# Patient Record
Sex: Male | Born: 1935 | Race: White | Hispanic: No | Marital: Married | Smoking: Former smoker
Health system: Southern US, Community
[De-identification: ages and names within clinical notes are randomized; demographics above are authoritative.]

## PROBLEM LIST (undated history)

## (undated) DIAGNOSIS — R269 Unspecified abnormalities of gait and mobility: Secondary | ICD-10-CM

## (undated) DIAGNOSIS — G20A1 Parkinson's disease without dyskinesia, without mention of fluctuations: Secondary | ICD-10-CM

## (undated) DIAGNOSIS — N138 Other obstructive and reflux uropathy: Secondary | ICD-10-CM

## (undated) DIAGNOSIS — I10 Essential (primary) hypertension: Secondary | ICD-10-CM

## (undated) DIAGNOSIS — N401 Enlarged prostate with lower urinary tract symptoms: Secondary | ICD-10-CM

## (undated) DIAGNOSIS — F0390 Unspecified dementia without behavioral disturbance: Secondary | ICD-10-CM

## (undated) DIAGNOSIS — I2510 Atherosclerotic heart disease of native coronary artery without angina pectoris: Secondary | ICD-10-CM

## (undated) DIAGNOSIS — G20 Parkinson's disease: Secondary | ICD-10-CM

## (undated) DIAGNOSIS — Q438 Other specified congenital malformations of intestine: Secondary | ICD-10-CM

## (undated) DIAGNOSIS — N529 Male erectile dysfunction, unspecified: Secondary | ICD-10-CM

## (undated) DIAGNOSIS — G4733 Obstructive sleep apnea (adult) (pediatric): Secondary | ICD-10-CM

## (undated) DIAGNOSIS — E785 Hyperlipidemia, unspecified: Secondary | ICD-10-CM

## (undated) HISTORY — DX: Parkinson's disease: G20

## (undated) HISTORY — DX: Unspecified dementia, unspecified severity, without behavioral disturbance, psychotic disturbance, mood disturbance, and anxiety: F03.90

## (undated) HISTORY — DX: Unspecified abnormalities of gait and mobility: R26.9

## (undated) HISTORY — DX: Atherosclerotic heart disease of native coronary artery without angina pectoris: I25.10

## (undated) HISTORY — PX: TONSILLECTOMY: SUR1361

## (undated) HISTORY — PX: KNEE SURGERY: SHX244

## (undated) HISTORY — DX: Essential (primary) hypertension: I10

## (undated) HISTORY — DX: Benign prostatic hyperplasia with lower urinary tract symptoms: N40.1

## (undated) HISTORY — DX: Other specified congenital malformations of intestine: Q43.8

## (undated) HISTORY — DX: Male erectile dysfunction, unspecified: N52.9

## (undated) HISTORY — PX: APPENDECTOMY: SHX54

## (undated) HISTORY — DX: Parkinson's disease without dyskinesia, without mention of fluctuations: G20.A1

## (undated) HISTORY — PX: OTHER SURGICAL HISTORY: SHX169

## (undated) HISTORY — DX: Hyperlipidemia, unspecified: E78.5

## (undated) HISTORY — DX: Benign prostatic hyperplasia with lower urinary tract symptoms: N13.8

## (undated) HISTORY — DX: Obstructive sleep apnea (adult) (pediatric): G47.33

## (undated) HISTORY — PX: SHOULDER SURGERY: SHX246

---

## 1998-12-28 ENCOUNTER — Inpatient Hospital Stay (HOSPITAL_COMMUNITY): Admission: RE | Admit: 1998-12-28 | Discharge: 1999-01-01 | Payer: Self-pay | Admitting: Orthopaedic Surgery

## 1999-03-01 ENCOUNTER — Inpatient Hospital Stay (HOSPITAL_COMMUNITY): Admission: RE | Admit: 1999-03-01 | Discharge: 1999-03-04 | Payer: Self-pay | Admitting: Orthopaedic Surgery

## 1999-07-11 ENCOUNTER — Other Ambulatory Visit: Admission: RE | Admit: 1999-07-11 | Discharge: 1999-07-11 | Payer: Self-pay | Admitting: Urology

## 2002-07-02 ENCOUNTER — Other Ambulatory Visit: Admission: RE | Admit: 2002-07-02 | Discharge: 2002-07-02 | Payer: Self-pay | Admitting: Dermatology

## 2003-05-12 ENCOUNTER — Inpatient Hospital Stay (HOSPITAL_BASED_OUTPATIENT_CLINIC_OR_DEPARTMENT_OTHER): Admission: RE | Admit: 2003-05-12 | Discharge: 2003-05-12 | Payer: Self-pay | Admitting: Cardiology

## 2003-12-09 ENCOUNTER — Ambulatory Visit (HOSPITAL_COMMUNITY): Admission: RE | Admit: 2003-12-09 | Discharge: 2003-12-09 | Payer: Self-pay | Admitting: Internal Medicine

## 2004-02-09 ENCOUNTER — Ambulatory Visit: Payer: Self-pay | Admitting: Family Medicine

## 2004-04-13 ENCOUNTER — Other Ambulatory Visit: Admission: RE | Admit: 2004-04-13 | Discharge: 2004-04-13 | Payer: Self-pay | Admitting: Dermatology

## 2004-04-27 ENCOUNTER — Ambulatory Visit: Payer: Self-pay | Admitting: Family Medicine

## 2004-05-11 ENCOUNTER — Ambulatory Visit: Payer: Self-pay | Admitting: *Deleted

## 2004-06-15 ENCOUNTER — Ambulatory Visit: Payer: Self-pay | Admitting: Family Medicine

## 2004-08-17 ENCOUNTER — Ambulatory Visit: Payer: Self-pay

## 2004-08-25 ENCOUNTER — Ambulatory Visit: Payer: Self-pay | Admitting: Family Medicine

## 2005-01-31 ENCOUNTER — Ambulatory Visit: Payer: Self-pay | Admitting: Family Medicine

## 2005-05-02 ENCOUNTER — Ambulatory Visit: Payer: Self-pay | Admitting: Family Medicine

## 2005-08-09 ENCOUNTER — Ambulatory Visit: Payer: Self-pay | Admitting: Family Medicine

## 2005-08-16 ENCOUNTER — Ambulatory Visit: Payer: Self-pay | Admitting: *Deleted

## 2005-08-22 ENCOUNTER — Encounter (HOSPITAL_COMMUNITY): Admission: RE | Admit: 2005-08-22 | Discharge: 2005-09-21 | Payer: Self-pay | Admitting: *Deleted

## 2005-08-22 ENCOUNTER — Ambulatory Visit: Payer: Self-pay | Admitting: *Deleted

## 2005-08-29 ENCOUNTER — Ambulatory Visit: Payer: Self-pay | Admitting: *Deleted

## 2005-09-18 ENCOUNTER — Ambulatory Visit: Payer: Self-pay | Admitting: Family Medicine

## 2005-10-11 ENCOUNTER — Ambulatory Visit: Payer: Self-pay | Admitting: Family Medicine

## 2005-11-14 ENCOUNTER — Ambulatory Visit (HOSPITAL_COMMUNITY): Admission: RE | Admit: 2005-11-14 | Discharge: 2005-11-15 | Payer: Self-pay | Admitting: Urology

## 2005-11-17 ENCOUNTER — Emergency Department (HOSPITAL_COMMUNITY): Admission: EM | Admit: 2005-11-17 | Discharge: 2005-11-17 | Payer: Self-pay | Admitting: Emergency Medicine

## 2005-12-27 ENCOUNTER — Ambulatory Visit: Payer: Self-pay | Admitting: Family Medicine

## 2006-01-03 ENCOUNTER — Ambulatory Visit: Payer: Self-pay | Admitting: Family Medicine

## 2006-01-04 ENCOUNTER — Ambulatory Visit (HOSPITAL_COMMUNITY): Admission: RE | Admit: 2006-01-04 | Discharge: 2006-01-04 | Payer: Self-pay | Admitting: Family Medicine

## 2006-01-17 ENCOUNTER — Ambulatory Visit (HOSPITAL_COMMUNITY): Payer: Self-pay | Admitting: Psychology

## 2006-02-13 ENCOUNTER — Ambulatory Visit (HOSPITAL_COMMUNITY): Payer: Self-pay | Admitting: Psychology

## 2006-03-13 ENCOUNTER — Ambulatory Visit: Payer: Self-pay | Admitting: Cardiovascular Disease

## 2006-04-15 ENCOUNTER — Encounter: Payer: Self-pay | Admitting: Family Medicine

## 2006-04-15 LAB — CONVERTED CEMR LAB
BUN: 13 mg/dL (ref 6–23)
Basophils Relative: 0 % (ref 0–1)
Cholesterol: 152 mg/dL (ref 0–200)
HCT: 43.6 % (ref 39.0–52.0)
LDL Cholesterol: 96 mg/dL (ref 0–99)
Lymphs Abs: 1.2 10*3/uL (ref 0.7–3.3)
MCV: 91.8 fL (ref 78.0–100.0)
Monocytes Absolute: 0.7 10*3/uL (ref 0.2–0.7)
Monocytes Relative: 17 % — ABNORMAL HIGH (ref 3–11)
Neutro Abs: 2.4 10*3/uL (ref 1.7–7.7)
Platelets: 193 10*3/uL (ref 150–400)
Potassium: 4.8 meq/L (ref 3.5–5.3)
RDW: 13.9 % (ref 11.5–14.0)
TSH: 1.507 microintl units/mL (ref 0.350–5.50)
Triglycerides: 61 mg/dL (ref <150)
VLDL: 12 mg/dL (ref 0–40)

## 2006-04-17 ENCOUNTER — Ambulatory Visit: Payer: Self-pay | Admitting: Family Medicine

## 2006-06-06 ENCOUNTER — Ambulatory Visit (HOSPITAL_COMMUNITY): Admission: RE | Admit: 2006-06-06 | Discharge: 2006-06-06 | Payer: Self-pay | Admitting: Neurology

## 2006-08-28 ENCOUNTER — Ambulatory Visit: Payer: Self-pay

## 2006-09-20 ENCOUNTER — Ambulatory Visit: Payer: Self-pay | Admitting: Cardiovascular Disease

## 2006-10-03 ENCOUNTER — Encounter: Payer: Self-pay | Admitting: Family Medicine

## 2006-10-03 LAB — CONVERTED CEMR LAB
Cholesterol: 141 mg/dL (ref 0–200)
LDL Cholesterol: 91 mg/dL (ref 0–99)
Total CHOL/HDL Ratio: 3.7
VLDL: 12 mg/dL (ref 0–40)

## 2006-10-09 ENCOUNTER — Ambulatory Visit: Payer: Self-pay | Admitting: Family Medicine

## 2006-11-14 ENCOUNTER — Encounter: Admission: RE | Admit: 2006-11-14 | Discharge: 2006-11-14 | Payer: Self-pay | Admitting: Neurology

## 2006-11-21 ENCOUNTER — Ambulatory Visit: Payer: Self-pay | Admitting: Family Medicine

## 2006-12-12 ENCOUNTER — Encounter: Admission: RE | Admit: 2006-12-12 | Discharge: 2006-12-12 | Payer: Self-pay | Admitting: Neurology

## 2007-01-01 ENCOUNTER — Ambulatory Visit: Payer: Self-pay | Admitting: Family Medicine

## 2007-01-08 ENCOUNTER — Encounter: Admission: RE | Admit: 2007-01-08 | Discharge: 2007-01-08 | Payer: Self-pay | Admitting: Neurology

## 2007-01-30 ENCOUNTER — Ambulatory Visit: Payer: Self-pay | Admitting: Gastroenterology

## 2007-01-30 ENCOUNTER — Ambulatory Visit (HOSPITAL_COMMUNITY): Admission: RE | Admit: 2007-01-30 | Discharge: 2007-01-30 | Payer: Self-pay | Admitting: Gastroenterology

## 2007-02-07 ENCOUNTER — Encounter: Payer: Self-pay | Admitting: Family Medicine

## 2007-02-07 LAB — CONVERTED CEMR LAB
Cholesterol: 146 mg/dL (ref 0–200)
Triglycerides: 51 mg/dL (ref <150)
VLDL: 10 mg/dL (ref 0–40)

## 2007-02-13 ENCOUNTER — Ambulatory Visit: Payer: Self-pay | Admitting: Family Medicine

## 2007-02-13 ENCOUNTER — Ambulatory Visit (HOSPITAL_COMMUNITY): Admission: RE | Admit: 2007-02-13 | Discharge: 2007-02-13 | Payer: Self-pay | Admitting: Family Medicine

## 2007-03-12 ENCOUNTER — Ambulatory Visit: Payer: Self-pay | Admitting: Cardiovascular Disease

## 2007-03-12 ENCOUNTER — Encounter (HOSPITAL_COMMUNITY): Admission: RE | Admit: 2007-03-12 | Discharge: 2007-04-02 | Payer: Self-pay | Admitting: Cardiovascular Disease

## 2007-06-12 ENCOUNTER — Encounter: Payer: Self-pay | Admitting: Family Medicine

## 2007-06-12 LAB — CONVERTED CEMR LAB
Basophils Relative: 1 % (ref 0–1)
CO2: 26 meq/L (ref 19–32)
Chloride: 101 meq/L (ref 96–112)
Hemoglobin: 15.2 g/dL (ref 13.0–17.0)
Lymphs Abs: 1 10*3/uL (ref 0.7–4.0)
MCHC: 33.8 g/dL (ref 30.0–36.0)
Monocytes Absolute: 0.7 10*3/uL (ref 0.1–1.0)
Monocytes Relative: 16 % — ABNORMAL HIGH (ref 3–12)
Potassium: 4.3 meq/L (ref 3.5–5.3)
RBC: 4.84 M/uL (ref 4.22–5.81)
Sodium: 138 meq/L (ref 135–145)
Total CHOL/HDL Ratio: 3.3
VLDL: 10 mg/dL (ref 0–40)

## 2007-06-18 ENCOUNTER — Ambulatory Visit: Payer: Self-pay | Admitting: Family Medicine

## 2007-08-20 ENCOUNTER — Ambulatory Visit: Payer: Self-pay

## 2007-08-21 ENCOUNTER — Ambulatory Visit: Payer: Self-pay | Admitting: Cardiovascular Disease

## 2007-10-01 ENCOUNTER — Encounter: Payer: Self-pay | Admitting: Family Medicine

## 2007-10-01 LAB — CONVERTED CEMR LAB
CO2: 29 meq/L (ref 19–32)
Calcium: 9.3 mg/dL (ref 8.4–10.5)
Cholesterol: 152 mg/dL (ref 0–200)
HDL: 44 mg/dL (ref >39)
LDL Cholesterol: 98 mg/dL (ref 0–99)
Potassium: 4.2 meq/L (ref 3.5–5.3)
Triglycerides: 51 mg/dL (ref <150)
VLDL: 10 mg/dL (ref 0–40)

## 2007-10-02 ENCOUNTER — Ambulatory Visit: Payer: Self-pay | Admitting: Family Medicine

## 2007-10-09 DIAGNOSIS — I10 Essential (primary) hypertension: Secondary | ICD-10-CM | POA: Insufficient documentation

## 2007-10-09 DIAGNOSIS — R269 Unspecified abnormalities of gait and mobility: Secondary | ICD-10-CM

## 2007-10-09 DIAGNOSIS — F068 Other specified mental disorders due to known physiological condition: Secondary | ICD-10-CM

## 2007-10-09 DIAGNOSIS — H9190 Unspecified hearing loss, unspecified ear: Secondary | ICD-10-CM | POA: Insufficient documentation

## 2007-11-17 ENCOUNTER — Telehealth: Payer: Self-pay | Admitting: Family Medicine

## 2008-01-02 ENCOUNTER — Ambulatory Visit: Payer: Self-pay | Admitting: Family Medicine

## 2008-01-02 DIAGNOSIS — G20 Parkinson's disease: Secondary | ICD-10-CM

## 2008-01-08 ENCOUNTER — Encounter: Payer: Self-pay | Admitting: Family Medicine

## 2008-02-04 ENCOUNTER — Telehealth: Payer: Self-pay | Admitting: Family Medicine

## 2008-03-02 ENCOUNTER — Ambulatory Visit: Payer: Self-pay | Admitting: Cardiovascular Disease

## 2008-03-11 ENCOUNTER — Telehealth: Payer: Self-pay | Admitting: Family Medicine

## 2008-04-27 ENCOUNTER — Telehealth: Payer: Self-pay | Admitting: Family Medicine

## 2008-04-30 ENCOUNTER — Encounter: Payer: Self-pay | Admitting: Family Medicine

## 2008-04-30 LAB — CONVERTED CEMR LAB
ALT: 8 units/L (ref 0–53)
AST: 18 units/L (ref 0–37)
Albumin: 4.5 g/dL (ref 3.5–5.2)
Alkaline Phosphatase: 54 units/L (ref 39–117)
CO2: 28 meq/L (ref 19–32)
Calcium: 9.5 mg/dL (ref 8.4–10.5)
Chloride: 100 meq/L (ref 96–112)
Glucose, Bld: 80 mg/dL (ref 70–99)
HDL: 51 mg/dL (ref >39)
LDL Cholesterol: 101 mg/dL — ABNORMAL HIGH (ref 0–99)
Sodium: 138 meq/L (ref 135–145)
VLDL: 15 mg/dL (ref 0–40)

## 2008-05-18 ENCOUNTER — Ambulatory Visit: Payer: Self-pay | Admitting: Family Medicine

## 2008-09-10 ENCOUNTER — Telehealth: Payer: Self-pay | Admitting: Family Medicine

## 2008-09-30 ENCOUNTER — Telehealth: Payer: Self-pay | Admitting: Family Medicine

## 2008-10-11 ENCOUNTER — Ambulatory Visit: Payer: Self-pay | Admitting: Family Medicine

## 2008-10-21 ENCOUNTER — Encounter: Payer: Self-pay | Admitting: Family Medicine

## 2008-11-01 ENCOUNTER — Encounter: Admission: RE | Admit: 2008-11-01 | Discharge: 2008-11-01 | Payer: Self-pay | Admitting: Neurology

## 2008-11-02 IMAGING — NM NM MYOCAR MULTI W/ SPECT
2 series · 12 of 12 positions shown · non-contrast
Comparison: none

CLINICAL DATA: Mr. Prince Kyle is a 78-year-old patient of myself and Dr. Pineas.  He has a moderate LAD disease by cath in Thursday March, 2006.  Study was done to rule out ischemia.  
 STRESS MYOVIEW STUDY:
 Adenosine protocol:  140 mcg/kg/minute of Adenosine infused over 4 minutes.  Heart rate increased from 50-68.  Blood pressure was stable.  Patient had no symptoms.  Baseline EKG was normal.  There was no high grade heart block during infusion.  
 SCINTIGRAPHIC RESULTS:  Images were reconstructed in the short axis, vertical, and horizontal long axis.  Resting and stress images were normal.  There was no evidence of ischemia or infarction.  Gated images suggested an ejection fraction of 48%; however, visually it appeared normal and there were no discrete regional wall motion abnormalities.

[Series 1: cr cardiac tc low dose · 6.52mm/px · 6 of 64 frames shown]
[frame 6/64]
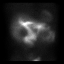
[frame 16/64]
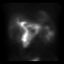
[frame 27/64]
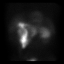
[frame 38/64]
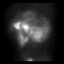
[frame 48/64]
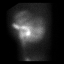
[frame 59/64]
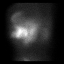

[Series 1: cs cardiac tc hi dose · 6.52mm/px · 6 of 512 frames shown]
[frame 43/512]
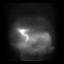
[frame 128/512]
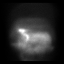
[frame 214/512]
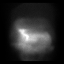
[frame 299/512]
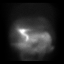
[frame 384/512]
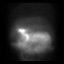
[frame 470/512]
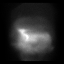

[12 of 12 positions shown; findings below may reference images not displayed]

IMPRESSION: Essentially normal Adenosine Myoview study with no evidence of ischemia or infarction.  Gated images suggest an ejection fraction of 48% but visually appears normal.

## 2008-11-04 ENCOUNTER — Encounter: Admission: RE | Admit: 2008-11-04 | Discharge: 2008-11-04 | Payer: Self-pay | Admitting: Neurology

## 2008-11-22 ENCOUNTER — Telehealth: Payer: Self-pay | Admitting: Family Medicine

## 2008-12-21 ENCOUNTER — Telehealth: Payer: Self-pay | Admitting: Family Medicine

## 2008-12-23 ENCOUNTER — Ambulatory Visit: Payer: Self-pay | Admitting: Family Medicine

## 2009-01-06 ENCOUNTER — Telehealth: Payer: Self-pay | Admitting: Family Medicine

## 2009-01-17 ENCOUNTER — Telehealth: Payer: Self-pay | Admitting: Family Medicine

## 2009-01-18 LAB — CONVERTED CEMR LAB
Basophils Absolute: 0 10*3/uL (ref 0.0–0.1)
Basophils Relative: 1 % (ref 0–1)
Chloride: 98 meq/L (ref 96–112)
Cholesterol: 156 mg/dL (ref 0–200)
Eosinophils Absolute: 0.1 10*3/uL (ref 0.0–0.7)
Eosinophils Relative: 2 % (ref 0–5)
Glucose, Bld: 84 mg/dL (ref 70–99)
HDL: 51 mg/dL (ref >39)
Hemoglobin: 14.1 g/dL (ref 13.0–17.0)
Lymphs Abs: 0.8 10*3/uL (ref 0.7–4.0)
Monocytes Absolute: 0.6 10*3/uL (ref 0.1–1.0)
Monocytes Relative: 16 % — ABNORMAL HIGH (ref 3–12)
Potassium: 4.8 meq/L (ref 3.5–5.3)
RDW: 13.8 % (ref 11.5–15.5)
Sodium: 134 meq/L — ABNORMAL LOW (ref 135–145)
TSH: 1.087 microintl units/mL (ref 0.350–4.500)
Total CHOL/HDL Ratio: 3.1

## 2009-02-02 ENCOUNTER — Ambulatory Visit: Payer: Self-pay | Admitting: Family Medicine

## 2009-02-02 ENCOUNTER — Ambulatory Visit (HOSPITAL_COMMUNITY): Admission: RE | Admit: 2009-02-02 | Discharge: 2009-02-02 | Payer: Self-pay | Admitting: Family Medicine

## 2009-02-02 DIAGNOSIS — R490 Dysphonia: Secondary | ICD-10-CM | POA: Insufficient documentation

## 2009-02-02 DIAGNOSIS — R05 Cough: Secondary | ICD-10-CM | POA: Insufficient documentation

## 2009-02-03 ENCOUNTER — Encounter: Payer: Self-pay | Admitting: Family Medicine

## 2009-02-07 ENCOUNTER — Telehealth: Payer: Self-pay | Admitting: Family Medicine

## 2009-02-23 ENCOUNTER — Encounter: Admission: RE | Admit: 2009-02-23 | Discharge: 2009-03-31 | Payer: Self-pay | Admitting: Podiatry

## 2009-02-23 ENCOUNTER — Telehealth: Payer: Self-pay | Admitting: Family Medicine

## 2009-03-01 ENCOUNTER — Ambulatory Visit: Payer: Self-pay | Admitting: Family Medicine

## 2009-03-01 DIAGNOSIS — L0291 Cutaneous abscess, unspecified: Secondary | ICD-10-CM

## 2009-03-01 DIAGNOSIS — L0390 Cellulitis, unspecified: Secondary | ICD-10-CM

## 2009-03-14 ENCOUNTER — Ambulatory Visit: Payer: Self-pay | Admitting: Family Medicine

## 2009-04-05 ENCOUNTER — Telehealth: Payer: Self-pay | Admitting: Family Medicine

## 2009-04-14 ENCOUNTER — Ambulatory Visit: Payer: Self-pay | Admitting: Family Medicine

## 2009-06-02 ENCOUNTER — Encounter: Admission: RE | Admit: 2009-06-02 | Discharge: 2009-06-02 | Payer: Self-pay | Admitting: Neurology

## 2009-06-11 ENCOUNTER — Emergency Department (HOSPITAL_COMMUNITY): Admission: EM | Admit: 2009-06-11 | Discharge: 2009-06-11 | Payer: Self-pay | Admitting: Emergency Medicine

## 2009-06-13 ENCOUNTER — Encounter: Payer: Self-pay | Admitting: Family Medicine

## 2009-06-14 ENCOUNTER — Ambulatory Visit: Payer: Self-pay | Admitting: Family Medicine

## 2009-06-14 ENCOUNTER — Ambulatory Visit (HOSPITAL_COMMUNITY): Admission: RE | Admit: 2009-06-14 | Discharge: 2009-06-14 | Payer: Self-pay | Admitting: Family Medicine

## 2009-06-14 DIAGNOSIS — D692 Other nonthrombocytopenic purpura: Secondary | ICD-10-CM | POA: Insufficient documentation

## 2009-06-14 DIAGNOSIS — M7989 Other specified soft tissue disorders: Secondary | ICD-10-CM | POA: Insufficient documentation

## 2009-06-14 DIAGNOSIS — R238 Other skin changes: Secondary | ICD-10-CM

## 2009-06-14 LAB — CONVERTED CEMR LAB
BUN: 7 mg/dL (ref 6–23)
CO2: 32 meq/L (ref 19–32)
Chloride: 94 meq/L — ABNORMAL LOW (ref 96–112)
Creatinine, Ser: 0.68 mg/dL (ref 0.40–1.50)
Eosinophils Absolute: 0 10*3/uL (ref 0.0–0.7)
Glucose, Bld: 113 mg/dL — ABNORMAL HIGH (ref 70–99)
HCT: 43.5 % (ref 39.0–52.0)
Lymphocytes Relative: 16 % (ref 12–46)
Lymphs Abs: 0.8 10*3/uL (ref 0.7–4.0)
MCHC: 34 g/dL (ref 30.0–36.0)
Monocytes Relative: 13 % — ABNORMAL HIGH (ref 3–12)
Platelets: 167 10*3/uL (ref 150–400)
Potassium: 4 meq/L (ref 3.5–5.3)
RBC: 4.64 M/uL (ref 4.22–5.81)
RDW: 14.1 % (ref 11.5–15.5)
Sodium: 130 meq/L — ABNORMAL LOW (ref 135–145)
WBC: 4.8 10*3/uL (ref 4.0–10.5)
aPTT: 32 s (ref 24–37)

## 2009-06-30 ENCOUNTER — Telehealth: Payer: Self-pay | Admitting: Family Medicine

## 2009-07-01 ENCOUNTER — Telehealth: Payer: Self-pay | Admitting: Family Medicine

## 2009-08-02 ENCOUNTER — Encounter: Admission: RE | Admit: 2009-08-02 | Discharge: 2009-08-02 | Payer: Self-pay | Admitting: Neurology

## 2009-08-03 ENCOUNTER — Encounter: Payer: Self-pay | Admitting: Family Medicine

## 2009-08-04 ENCOUNTER — Telehealth: Payer: Self-pay | Admitting: Family Medicine

## 2009-08-15 ENCOUNTER — Ambulatory Visit: Payer: Self-pay | Admitting: Family Medicine

## 2009-08-17 LAB — CONVERTED CEMR LAB
Chloride: 96 meq/L (ref 96–112)
Cholesterol: 153 mg/dL (ref 0–200)
Creatinine, Ser: 0.77 mg/dL (ref 0.40–1.50)
LDL Cholesterol: 91 mg/dL (ref 0–99)
Sodium: 134 meq/L — ABNORMAL LOW (ref 135–145)
Total CHOL/HDL Ratio: 3
Triglycerides: 55 mg/dL (ref <150)
VLDL: 11 mg/dL (ref 0–40)

## 2009-08-22 ENCOUNTER — Encounter: Payer: Self-pay | Admitting: Cardiovascular Disease

## 2009-08-22 DIAGNOSIS — I6529 Occlusion and stenosis of unspecified carotid artery: Secondary | ICD-10-CM

## 2009-08-23 ENCOUNTER — Encounter: Payer: Self-pay | Admitting: Cardiovascular Disease

## 2009-08-23 ENCOUNTER — Ambulatory Visit: Payer: Self-pay

## 2009-11-15 ENCOUNTER — Ambulatory Visit: Payer: Self-pay | Admitting: Family Medicine

## 2009-11-23 ENCOUNTER — Telehealth: Payer: Self-pay | Admitting: Family Medicine

## 2009-11-25 ENCOUNTER — Telehealth: Payer: Self-pay | Admitting: Family Medicine

## 2009-11-28 ENCOUNTER — Ambulatory Visit: Admission: RE | Admit: 2009-11-28 | Discharge: 2009-11-28 | Payer: Self-pay | Admitting: Family Medicine

## 2009-11-28 ENCOUNTER — Encounter: Payer: Self-pay | Admitting: Family Medicine

## 2009-11-29 ENCOUNTER — Telehealth: Payer: Self-pay | Admitting: Family Medicine

## 2009-12-08 ENCOUNTER — Telehealth: Payer: Self-pay | Admitting: Family Medicine

## 2009-12-14 ENCOUNTER — Encounter: Payer: Self-pay | Admitting: Family Medicine

## 2009-12-15 ENCOUNTER — Encounter: Payer: Self-pay | Admitting: Family Medicine

## 2009-12-21 ENCOUNTER — Telehealth: Payer: Self-pay | Admitting: Family Medicine

## 2010-01-19 ENCOUNTER — Telehealth: Payer: Self-pay | Admitting: Family Medicine

## 2010-02-17 ENCOUNTER — Telehealth: Payer: Self-pay | Admitting: Family Medicine

## 2010-02-20 ENCOUNTER — Ambulatory Visit: Payer: Self-pay | Admitting: Family Medicine

## 2010-03-01 ENCOUNTER — Telehealth: Payer: Self-pay | Admitting: Family Medicine

## 2010-03-06 ENCOUNTER — Telehealth (INDEPENDENT_AMBULATORY_CARE_PROVIDER_SITE_OTHER): Payer: Self-pay | Admitting: *Deleted

## 2010-03-06 ENCOUNTER — Encounter: Payer: Self-pay | Admitting: Family Medicine

## 2010-03-06 DIAGNOSIS — K219 Gastro-esophageal reflux disease without esophagitis: Secondary | ICD-10-CM

## 2010-03-08 ENCOUNTER — Telehealth: Payer: Self-pay | Admitting: Family Medicine

## 2010-04-17 ENCOUNTER — Telehealth: Payer: Self-pay | Admitting: Family Medicine

## 2010-04-23 ENCOUNTER — Encounter: Payer: Self-pay | Admitting: Neurology

## 2010-04-28 ENCOUNTER — Ambulatory Visit
Admission: RE | Admit: 2010-04-28 | Discharge: 2010-04-28 | Payer: Self-pay | Source: Home / Self Care | Attending: Family Medicine | Admitting: Family Medicine

## 2010-05-02 NOTE — Progress Notes (Signed)
Summary: speak with brandi   Phone Note Call from Patient   Summary of Call: pts wife would like to speak with brandi [phone] Initial call taken by: Rudene Anda,  July 01, 2009 9:54 AM  Follow-up for Phone Call        called back and left message Follow-up by: Everitt Amber LPN,  July 01, 2009 1:54 PM  Additional Follow-up for Phone Call Additional follow up Details #1::        She already picked up some hose at [facility] Additional Follow-up by: Everitt Amber LPN,  July 04, 2009 12:54 PM

## 2010-05-02 NOTE — Assessment & Plan Note (Signed)
Summary: office visit   Vital Signs:  Patient profile:   77 year old male Height:      67 inches Weight:      190.75 pounds BMI:     29.98 O2 Sat:      98 % on Room air Pulse rate:   56 / minute Pulse rhythm:   regular Resp:     16 per minute BP sitting:   122 / 80  (left arm)  Vitals Entered By: Worthy Keeler LPN (April 14, 2009 10:21 AM)  Nutrition Counseling: Patient's BMI is greater than 25 and therefore counseled on weight management options.  O2 Flow:  Room air CC: follow-up visit Is Patient Diabetic? No Pain Assessment Patient in pain? no        Primary Care Provider:  Syliva Overman MD  CC:  follow-up visit.  History of Present Illness: Reports  that he is  doing well. He is on his way to [place] to spend 3 months with his sister, and he wanted to come by one more time before he left . He leaves in the morning. His wife reprts that the area on his back is "drying up", no longer draining and does not appear to be painful.j Denies recent fever or chills. Denies sinus pressure, nasal congestion , ear pain or sore throat. Denies chest congestion, or cough productive of sputum. Denies chest pain, palpitations, PND, orthopnea or leg swelling. Denies abdominal pain, nausea, vomitting, diarrhea or constipation. Denies change in bowel movements or bloody stool.      Allergies (verified): No Known Drug Allergies  Review of Systems      See HPI  Physical Exam  General:  Well-developed,well-nourished,in no acute distress; alert,appropriate and cooperative throughout examination HEENT: No facial asymmetry,  EOMI, No sinus tenderness, TM's Clear, oropharynx  pink and moist.   Chest: Clear to auscultation bilaterally.  CVS: S1, S2, No murmurs, No S3.   Abd: Soft, Nontender.  MS: Adequate ROM spine, hips, shoulders and knees.  Ext: No edema.   CNS: CN 2-12 intact, power tone and sensation normal throughout.   Skin: Intact,  Psych: Good eye contact, flat  affect.  Memory losst, not anxious or depressed appearing.    Impression & Recommendations:  Problem # 1:  CELLULITIS (ICD-682.9) Assessment Improved  His updated medication list for this problem includes:    Doxycycline Hyclate 100 Mg Caps (Doxycycline hyclate) .Marland Kitchen... Take 1 capsule by mouth two times a day    Doxycycline Hyclate 100 Mg Caps (Doxycycline hyclate) ..... One tab by mouth bid  Problem # 2:  PARKINSON'S DISEASE (ICD-332.0) Assessment: Unchanged  Problem # 3:  HYPERTENSION (ICD-401.9) Assessment: Improved  His updated medication list for this problem includes:    Atenolol 25 Mg Tabs (Atenolol) ..... One tab by mouth once daily    Lisinopril-hydrochlorothiazide 20-25 Mg Tabs (Lisinopril-hydrochlorothiazide) .Marland Kitchen... Take one tab by mouth qd  BP today: 122/80 Prior BP: 138/80 (03/14/2009)  Labs Reviewed: K+: 4.8 (01/18/2009) Creat: : 0.74 (01/18/2009)   Chol: 156 (01/18/2009)   HDL: 51 (01/18/2009)   LDL: 95 (01/18/2009)   TG: 49 (01/18/2009)  Complete Medication List: 1)  Atenolol 25 Mg Tabs (Atenolol) .... One tab by mouth once daily 2)  Lisinopril-hydrochlorothiazide 20-25 Mg Tabs (Lisinopril-hydrochlorothiazide) .... Take one tab by mouth qd 3)  Avodart 0.5 Mg Caps (Dutasteride) .... Take one cap by mouth once daily 4)  Flomax 0.4 Mg Xr24h-cap (Tamsulosin hcl) .... Take one cap by mouth once daily 5)  Carbidopa-levodopa Cr 25-100 Mg Cr-tabs (Carbidopa-levodopa) .... Take one tab by mouth 4 times daily 6)  Amantadine Hcl 100 Mg Caps (Amantadine hcl) .... Take 1 cap by mouth once daily 7)  Comtan 200 Mg Tabs (Entacapone) .... Take one half tab by mouth four times a day 8)  Exelon 4.6 Mg/24hr Pt24 (Rivastigmine) .... 2 patches every morning 9)  Medrol (pak) 4 Mg Tabs (Methylprednisolone) .... Uad 10)  Prilosec Otc 20 Mg Tbec (Omeprazole magnesium) .... Take 1 capsule by mouth once a day 11)  Doxycycline Hyclate 100 Mg Caps (Doxycycline hyclate) .... Take 1 capsule by  mouth two times a day 12)  Doxycycline Hyclate 100 Mg Caps (Doxycycline hyclate) .... Take 1 capsule by mouth two times a day 13)  Doxycycline Hyclate 100 Mg Caps (Doxycycline hyclate) .... One tab by mouth bid 14)  Cutivate 0.05 % Crea (Fluticasone propionate) .... Apply topically two times a day prn  Patient Instructions: 1)  Please schedule a follow-up appointment in 3 months. 2)  the sore on your back looks much better. 3)  i will give you a script for some doxycycline yto hold on to, pls stop squeezing the area, keep it clean and uncovered, it is nearly fully heaed. 4)  if it starts to be painful and drainagain you will need the meds. 5)  SAFE TRIP , and GOD'S  SPEED! 6)  I will soon see you again Prescriptions: CUTIVATE 0.05 % CREA (FLUTICASONE PROPIONATE) apply topically two times a day prn  #30gm x 1   Entered by:   Worthy Keeler LPN   Authorized by:   Syliva Overman MD   Signed by:   Syliva Overman MD on 04/20/2009   Method used:   Handwritten   RxID:   [id number] DOXYCYCLINE HYCLATE 100 MG CAPS (DOXYCYCLINE HYCLATE) one tab by mouth bid  #20 x 0   Entered and Authorized by:   Syliva Overman MD   Signed by:   Syliva Overman MD on 04/20/2009   Method used:   Handwritten   RxID:   [id number]

## 2010-05-02 NOTE — Assessment & Plan Note (Signed)
Summary: f up   Vital Signs:  Patient profile:   77 year old male Height:      67 inches Weight:      189 pounds BMI:     29.71 Pulse rate:   62 / minute Pulse rhythm:   regular Resp:     16 per minute BP sitting:   122 / 82  (left arm) Cuff size:   regular  Vitals Entered By: Everitt Amber LPN (Aug 15, 2009 2:52 PM)  Nutrition Counseling: Patient's BMI is greater than 25 and therefore counseled on weight management options. CC: Folloq up chronic problems   Primary Care Provider:  Syliva Overman MD  CC:  Folloq up chronic problems.  History of Present Illness: Reports  that he has been doing fairlly well Denies recent fever or chills. Denies sinus pressure, nasal congestion , ear pain or sore throat. Denies chest congestion, or cough productive of sputum. Denies chest pain, palpitations, PND, orthopnea or leg swelling. Denies abdominal pain, nausea, vomitting, diarrhea or constipation. Denies change in bowel movements or bloody stool. Has chronic poor urinary stream for which he takes medication. has chronic  joint pain, with  reduced mobility. Denies headaches, vertigo, seizures. Denies depression, anxiety or insomnia. 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 One Tab By Mouth Once Daily 2)  Lisinopril-Hydrochlorothiazide 20-25 Mg Tabs (Lisinopril-Hydrochlorothiazide) .... Take One Tab By Mouth Qd 3)  Avodart 0.5 Mg Caps (Dutasteride) .... Take One Cap By Mouth Once Daily 4)  Flomax 0.4 Mg Xr24h-Cap (Tamsulosin Hcl) .... Take One Cap By Mouth Once Daily 5)  Carbidopa-Levodopa Cr 25-100 Mg Cr-Tabs (Carbidopa-Levodopa) .... Take One Tab By Mouth 4 Times Daily 6)  Amantadine Hcl 100 Mg Caps (Amantadine Hcl) .... Take 1 Cap By Mouth Once Daily 7)  Comtan 200 Mg Tabs (Entacapone) .... Take One Half Tab By Mouth Four Times A Day 8)  Exelon 4.6 Mg/24hr Pt24  (Rivastigmine) .... 2 Patches Every Morning 9)  Doxycycline Hyclate 100 Mg Caps (Doxycycline Hyclate) .... One Tab By Mouth Bid 10)  Cutivate 0.05 % Crea (Fluticasone Propionate) .... Apply Topically Two Times A Day Prn 11)  15-30mm Hg Knee High Stockings 12)  Omeprazole 20 Mg Cpdr (Omeprazole) .... One Tab By Mouth Once Daily   Discontinue Prilosec  Allergies (verified): No Known Drug Allergies  Review of Systems      See HPI General:  Complains of fatigue and weakness; denies chills and fever. Eyes:  Denies discharge and red eye. GU:  Complains of urinary hesitancy; denies nocturia; on medication for this. MS:  Complains of joint pain, muscle weakness, and stiffness. Derm:  Complains of lesion(s) and rash; 2 week h/o red area on buttock, no lesion seen on exam, however pt continues to have recurrence of infected sebaceous cysts. Neuro:  Complains of difficulty with concentration, disturbances in coordination, memory loss, poor balance, tremors, and weakness; denies tingling. Psych:  Denies anxiety, easily angered, suicidal thoughts/plans, and thoughts of violence. Endo:  Denies excessive thirst and polyuria. Heme:  Denies abnormal bruising and bleeding. Allergy:  Denies hives or rash and itching eyes.  Physical Exam  General:  Well-developed,well-nourished,in no acute distress; alert,appropriate and cooperative throughout examinationPt unable to stand withouit assistance and uses a wheelchair for mobility HEENT: No facial asymmetry,  EOMI, No sinus tenderness, TM's Clear, oropharynx  pink and moist.   Chest:  Clear to auscultation bilaterally.  CVS: S1, S2, No murmurs, No S3.   Abd: Soft, Nontender.  MS: decreased  ROM spine, hips, shoulders and knees.  Ext: No edema.   CNS: CN 2-12 intact, power tone and sensation normal throughout.   Skin: Intact, erythema of  sebaceous cyst on his back, no buttock lesion Psych: Good eye contact, flat affect.  Memory loss, not anxious or  depressed appearing.    Impression & Recommendations:  Problem # 1:  PARKINSON'S DISEASE (ICD-332.0) Assessment Unchanged pt is followed by neurology  Problem # 2:  DEMENTIA (ICD-294.8) Assessment: Unchanged pt to continue exelon patch  Problem # 3:  HYPERTENSION (ICD-401.9) Assessment: Unchanged  His updated medication list for this problem includes:    Atenolol 25 Mg Tabs (Atenolol) ..... One tab by mouth once daily    Lisinopril-hydrochlorothiazide 20-25 Mg Tabs (Lisinopril-hydrochlorothiazide) .Marland Kitchen... Take one tab by mouth qd  Orders: T-Basic Metabolic Panel ([id number])  BP today: 122/82 Prior BP: 128/80 (06/14/2009)  Labs Reviewed: K+: 4.0 (06/14/2009) Creat: : 0.68 (06/14/2009)   Chol: 156 (01/18/2009)   HDL: 51 (01/18/2009)   LDL: 95 (01/18/2009)   TG: 49 (01/18/2009)  Problem # 4:  CELLULITIS (ICD-682.9) Assessment: Deteriorated  His updated medication list for this problem includes:    Doxycycline Hyclate 100 Mg Caps (Doxycycline hyclate) ..... One tab by mouth bid  Complete Medication List: 1)  Atenolol 25 Mg Tabs (Atenolol) .... One tab by mouth once daily 2)  Lisinopril-hydrochlorothiazide 20-25 Mg Tabs (Lisinopril-hydrochlorothiazide) .... Take one tab by mouth qd 3)  Avodart 0.5 Mg Caps (Dutasteride) .... Take one cap by mouth once daily 4)  Flomax 0.4 Mg Xr24h-cap (Tamsulosin hcl) .... Take one cap by mouth once daily 5)  Carbidopa-levodopa Cr 25-100 Mg Cr-tabs (Carbidopa-levodopa) .... Take one tab by mouth 4 times daily 6)  Amantadine Hcl 100 Mg Caps (Amantadine hcl) .... Take 1 cap by mouth once daily 7)  Comtan 200 Mg Tabs (Entacapone) .... Take one half tab by mouth four times a day 8)  Exelon 4.6 Mg/24hr Pt24 (Rivastigmine) .... 2 patches every morning 9)  Doxycycline Hyclate 100 Mg Caps (Doxycycline hyclate) .... One tab by mouth bid 10)  Cutivate 0.05 % Crea (Fluticasone propionate) .... Apply topically two times a day prn 11)  15-29mm Hg Knee  High Stockings  12)  Omeprazole 20 Mg Cpdr (Omeprazole) .... One tab by mouth once daily   discontinue prilosec  Other Orders: T-Lipid Profile ([id number])  Patient Instructions: 1)  Please schedule a follow-up appointment in 3 months. 2)  BMP prior to visit, ICD-9: 3)  Lipid Panel prior to visit, ICD-9: 4)  No med changes at this time

## 2010-05-02 NOTE — Progress Notes (Signed)
Summary: CHOL CK  Phone Note Call from Patient   Summary of Call: WANTS TO KNOW DOES HE NEED HAVE HIS CHOL CHECKED  WHEN HE COMES IN MONDAY CALL BACK AND LET THEM KNOW Initial call taken by: Lind Guest,  February 17, 2010 10:09 AM  Follow-up for Phone Call        nl in may and on no meds, will not need it till next yr Follow-up by: Syliva Overman MD,  February 17, 2010 11:47 AM  Additional Follow-up for Phone Call Additional follow up Details #1::        patient aware Additional Follow-up by: Adella Hare LPN,  February 17, 2010 1:16 PM

## 2010-05-02 NOTE — Progress Notes (Signed)
Summary: PAPER FROM [facility]  Phone Note Call from Patient   Summary of Call: RUBY CALLED AND WANTS TO KNOW DID SHE TEL YOU ABOUT WHEN THE PAPERS CAME FROM [facility] TO PUT HER NAME AND # ON IT IF NOT PLEASE DO IT Initial call taken by: Lind Guest,  November 29, 2009 8:48 AM  Follow-up for Phone Call        noted  Follow-up by: Everitt Amber LPN,  November 29, 2009 10:49 AM     Appended Document: PAPER FROM [facility] faxed to [phone] per patient request

## 2010-05-02 NOTE — Progress Notes (Signed)
Summary: PLACE ON BACK IS STILL RUNNING WITH INFECTION  Phone Note Call from Patient   Summary of Call: WIFE CALLED AND SAID THAT PLACE ON BACK IS RUNNING INFECTION GOING TO [place]  DOES SOMETHING ELSE NEED TO BE DONE  CALL BACK AT [phone] Initial call taken by: Lind Guest,  April 05, 2009 8:39 AM  Follow-up for Phone Call        pls get  appt with surgery or dermatology for infected cyst on back, he has been to dr Margo Aye before, [place] or [facility] whatever they prefer Follow-up by: Syliva Overman MD,  April 05, 2009 12:24 PM  Additional Follow-up for Phone Call Additional follow up Details #1::        pt has appt with dr.jenkins for 04/07/2009 9:30. pt notified  Additional Follow-up by: Rudene Anda,  April 05, 2009 1:40 PM

## 2010-05-02 NOTE — Progress Notes (Signed)
  Phone Note Other Incoming   Caller: dr Octivia Canion  Summary of Call: i called the [facility], there is no charge to the pt for eval for wheelchair, pls let Ruby know, pls send demographics of pt to [facility] along with my script for oT , this will get the process started, theeval will be done at [facility], let her know, callCarol at [phone] with any further questions pls Initial call taken by: Syliva Overman MD,  November 25, 2009 2:38 PM  Follow-up for Phone Call        called patient left message Follow-up by: Everitt Amber LPN,  November 25, 2009 3:00 PM  Additional Follow-up for Phone Call Additional follow up Details #1::        patient wife aware Additional Follow-up by: Adella Hare LPN,  November 28, 2009 8:37 AM

## 2010-05-02 NOTE — Progress Notes (Signed)
Summary: wheelchair  Phone Note Call from Patient   Summary of Call: they have never recieved the paper for the wheelchair please call sharon at [phone] press o ask for sharon Initial call taken by: Lind Guest,  March 01, 2010 3:51 PM  Follow-up for Phone Call        dr simpson  is working on this Follow-up by: Adella Hare LPN,  March 03, 2010 4:12 PM

## 2010-05-02 NOTE — Letter (Signed)
Summary: WHEELCHAIR ASSESMENT  WHEELCHAIR ASSESMENT   Imported By: Lind Guest 12/15/2009 09:53:58  _____________________________________________________________________  External Attachment:    Type:   Image     Comment:   External Document

## 2010-05-02 NOTE — Assessment & Plan Note (Signed)
Summary: office visit   Vital Signs:  Patient profile:   77 year old male Height:      67 inches Weight:      197.50 pounds BMI:     31.04 O2 Sat:      98 % on Room air Pulse rate:   52 / minute Pulse rhythm:   regular Resp:     16 per minute BP sitting:   120 / 80  (right arm)  Vitals Entered By: Adella Hare LPN (February 20, 2010 9:38 AM)  Nutrition Counseling: Patient's BMI is greater than 25 and therefore counseled on weight management options.  O2 Flow:  Room air CC: follow-up visit Is Patient Diabetic? No Pain Assessment Patient in pain? no      Comments did not bring meds to ov   Primary Care Provider:  Syliva Overman MD  CC:  follow-up visit.  History of Present Illness: Earl Hayes is under my general care, and is followed by neurology for parkinsons disease, with dementia. The purpose of this  visit is a mobility face to face examination. due to patient's dx of Parkinson's disease, and spinal stenosis, he is uinable to perform ADLs, including, but not limited to bathing, grooming, cooking, cleaning , and at times has difficulty feeding himself due toupper extremity weakness and reduced rOM, a manual wheelchair is inappropriate, due to te pain and fatigue associated with the diagnoses.  Earl Hayes will need a power wheelchair for lifetime use. He is unable to use a power scooter due to inability of safe transfer to the scooter Reports  that the feels fairly well.hiss mobility limitations are a source of concern, as well as safety in the home , he has had falls.no visible injury sustained. Denies recent fever or chills. Denies sinus pressure, nasal congestion , ear pain or sore throat. Denies chest congestion, or cough productive of sputum. Denies chest pain, palpitations, PND, orthopnea or leg swelling. Denies abdominal pain, nausea, vomitting, diarrhea or constipation. Denies change in bowel movements or bloody stool. Denies dysuria , frequency,  incontinence or hesitancy.  Denies headaches, vertigo, seizures. Denies depression, anxiety or insomnia. Denies  rash, lesions, or itch.       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 Eyes:  Complains of vision loss-both eyes; denies discharge and red eye. GU:  Complains of urinary hesitancy; pt on avodart and flomax and followed by urology. MS:  Complains of joint pain, loss of strength, low back pain, mid back pain, muscle weakness, and stiffness; progressive symptoms , with increasingly limited mobility and stability. Derm:  Complains of itching and rash. Neuro:  Complains of difficulty with concentration, disturbances in coordination, falling down, memory loss, poor balance, tremors, and weakness; denies headaches, seizures, and tingling; spouse notes continued deterioration in patient's memory,mental agility as well as physical ability to get around in the home and care for himself, she is here primarily for consultation re a power wheelchair.. Endo:  Denies cold intolerance, excessive hunger, excessive thirst, and excessive urination. Heme:  Denies abnormal bruising and bleeding. Allergy:  Denies hives or rash and itching eyes.  Physical Exam  General:  Well-developed,well-nourished,in no acute distress; alert,appropriate and cooperative throughout examinationPt unable to stand withouit assistance and uses a wheelchair for mobility. HEENT: No facial asymmetry,  EOMI, No sinus tenderness, TM's Clear,  oropharynx  pink and moist. neck; decreased ROM with trapezius spasm  Chest: Clear to auscultation bilaterally.  CVS: S1, S2, No murmurs, No S3.   Abd: Soft, Nontender.  MS: decreased  ROM spine, hips, shoulders and knees.  Ext: No edema.   CNS: CN 2-12 intact,reduced power in both upper and lower extremities, sensation  intact, tremor  noted in uper extremities. Pt hasabnormal gait, his coordination and balance are impaired, finger nose testing is abnormal.  Skin: Intact,  Psych: Good eye contact, flat affect.  Memory loss, not anxious or depressed appearing.    Impression & Recommendations:  Problem # 1:  DEMENTIA (ICD-294.8) Assessment Deteriorated pt'swife to discuss with neurology, currently on  exelon patch only  Problem # 2:  HYPERTENSION (ICD-401.9) Assessment: Unchanged  Reviewed preventive care protocols, scheduled due services, and updated immunizations.  His updated medication list for this problem includes:    Atenolol 25 Mg Tabs (Atenolol) ..... One tab by mouth once daily    Lisinopril-hydrochlorothiazide 20-25 Mg Tabs (Lisinopril-hydrochlorothiazide) .Marland Kitchen... Take one tab by mouth qd  BP today: 120/80 Prior BP: 128/82 (11/15/2009)  Labs Reviewed: K+: 4.4 (08/16/2009) Creat: : 0.77 (08/16/2009)   Chol: 153 (08/16/2009)   HDL: 51 (08/16/2009)   LDL: 91 (08/16/2009)   TG: 55 (08/16/2009)  Problem # 3:  PARKINSON'S DISEASE (ICD-332.0) Assessment: Deteriorated increased mobility issues  as well as difficulty with ADL's , power wheelchair will be extremely benficial. follwed and teeated by neurology , on carbidopa/levodopa  Problem # 4:  HEARING LOSS (ICD-389.9) Assessment: Comment Only uses hearing aids  Problem # 5:  GERD (ICD-530.81) Assessment: Unchanged  His updated medication list for this problem includes:    Omeprazole 20 Mg Cpdr (Omeprazole) ..... One tab by mouth once daily   discontinue prilosec  Complete Medication List: 1)  Atenolol 25 Mg Tabs  (Atenolol) .... One tab by mouth once daily 2)  Lisinopril-hydrochlorothiazide 20-25 Mg Tabs (Lisinopril-hydrochlorothiazide) .... Take one tab by mouth qd 3)  Avodart 0.5 Mg Caps (Dutasteride) .... Take one cap by mouth once daily 4)  Flomax 0.4 Mg Xr24h-cap (Tamsulosin hcl) .... Take one cap by mouth once daily 5)  Carbidopa-levodopa Cr 25-100 Mg Cr-tabs (Carbidopa-levodopa) .... Take one tab by mouth 4 times daily 6)  Amantadine Hcl 100 Mg Caps (Amantadine hcl) .... Take 1 cap by mouth once daily 7)  Comtan 200 Mg Tabs (Entacapone) .... Take one half tab by mouth four times a day 8)  Exelon 4.6 Mg/24hr Pt24 (Rivastigmine) .... 2 patches every morning 9)  Cutivate 0.05 % Crea (Fluticasone propionate) .... Apply topically two times a day prn 10)  15-49mm Hg Knee High Stockings  11)  Omeprazole 20 Mg Cpdr (Omeprazole) .... One tab by mouth once daily   discontinue prilosec  Patient Instructions: 1)  Please schedule a follow-up appointment in 4 months. 2)  No med  changes at this time. 3)  All the best for the new year. 4)  Keep the faith, and keep your spirits up. 5)  i will complete the form for the power wheelchair 6)  Pls call your neurologist for an appt. 7)  We will refer you to [facility]   Orders Added: 1)  Est. Patient Level IV [16109]

## 2010-05-02 NOTE — Miscellaneous (Signed)
Summary: Orders Update  Clinical Lists Changes  Problems: Added new problem of CAROTID ARTERY DISEASE (ICD-433.10) Orders: Added new Test order of Carotid Duplex (Carotid Duplex) - Signed 

## 2010-05-02 NOTE — Progress Notes (Signed)
Summary: [facility]   Imported By: Lind Guest 06/24/2009 13:50:48  _____________________________________________________________________  External Attachment:    Type:   Image     Comment:   External Document

## 2010-05-02 NOTE — Progress Notes (Signed)
Summary: wife/ wheelchair  Phone Note Call from Patient   Summary of Call: wife called nad was wondering about the paper work for the wheelchair  please call back to let her know what is going on she is worried Initial call taken by: Lind Guest,  March 08, 2010 11:05 AM  Follow-up for Phone Call        patient has called agin it is very important that she recieves these messge today Follow-up by: Lind Guest,  March 09, 2010 9:56 AM  Additional Follow-up for Phone Call Additional follow up Details #1::        patient wife aware Additional Follow-up by: Adella Hare LPN,  March 09, 2010 10:02 AM

## 2010-05-02 NOTE — Progress Notes (Signed)
Summary: power wheel chair    Summary of Call: would like to get a power wheel chair. [phone] Initial call taken by: Rudene Anda,  November 23, 2009 10:45 AM  Follow-up for Phone Call        PT eval? Follow-up by: Adella Hare LPN,  November 23, 2009 11:02 AM  Additional Follow-up for Phone Call Additional follow up Details #1::        pls refer pt for pT eval for power wheelchair Additional Follow-up by: Syliva Overman MD,  November 23, 2009 12:58 PM    pt was referred to [facility]. they will call pt back with appt. Rudene Anda  November 23, 2009 2:48 PM

## 2010-05-02 NOTE — Miscellaneous (Signed)
  Clinical Lists Changes  Medications: Added new medication of JALYN 0.5-0.4 MG CAPS (DUTASTERIDE-TAMSULOSIN HCL) Take 1 tablet by mouth once a day - Signed Removed medication of JALYN 0.5-0.4 MG CAPS (DUTASTERIDE-TAMSULOSIN HCL) Take 1 tablet by mouth once a day Rx of JALYN 0.5-0.4 MG CAPS (DUTASTERIDE-TAMSULOSIN HCL) Take 1 tablet by mouth once a day;  #30 x 0;  Signed;  Entered by: Everitt Amber LPN;  Authorized by: Syliva Overman MD;  Method used: Printed then faxed to [facility] [address] [place], [place], [place]  [address], Ph: [phone], Fax: [phone]    Prescriptions: JALYN 0.5-0.4 MG CAPS (DUTASTERIDE-TAMSULOSIN HCL) Take 1 tablet by mouth once a day  #30 x 0   Entered by:   Everitt Amber LPN   Authorized by:   Syliva Overman MD   Signed by:   Everitt Amber LPN on 13/10/6576   Method used:   Printed then faxed to ...       [facility] (retail)       [address]       [place], [place]  [address]       Ph: [phone]       Fax: [phone]   RxID:   [id number]

## 2010-05-02 NOTE — Letter (Signed)
Summary: ORDER FOR PT WHEELCHAIR  ORDER FOR PT WHEELCHAIR   Imported By: Lind Guest 11/28/2009 16:09:35  _____________________________________________________________________  External Attachment:    Type:   Image     Comment:   External Document

## 2010-05-02 NOTE — Assessment & Plan Note (Signed)
Summary: F UP FROM ED   Vital Signs:  Patient profile:   77 year old male Height:      67 inches Weight:      196 pounds BMI:     30.81 Pulse rate:   70 / minute Pulse rhythm:   regular Resp:     16 per minute BP sitting:   128 / 80  (left arm) Cuff size:   regular  Vitals Entered By: Everitt Amber LPN (June 14, 2009 9:55 AM)  Nutrition Counseling: Patient's BMI is greater than 25 and therefore counseled on weight management options. CC: had a place on his right leg in the front and calf area and it was really red. He was given an antibiotic and was told to alternate heat and cold and elevated. Wife states that legs have been staying swollen and he may need his fluid pills increased, Abdominal Pain   Primary Care Provider:  Syliva Overman MD  CC:  had a place on his right leg in the front and calf area and it was really red. He was given an antibiotic and was told to alternate heat and cold and elevated. Wife states that legs have been staying swollen and he may need his fluid pills increased and Abdominal Pain.  History of Present Illness: 4 day h/o right calfpain, swelling and erythema, was in the Ed and has been started on antibiotics.Pt's wife does not feel he is getting any better and has concerns about this.He also has an area of redness on the calf and some swelling [place] He denies couugh or hemoptysis he is minimally ambulatory, in a wheelchair over 90% of the time while awake. He has had no recent fever or chills. He denies head or chest congestion. He denies any changein bowel movements, and his apetite is good.    Allergies: No Known Drug Allergies  Review of Systems      See HPI Eyes:  Denies discharge and red eye. MS:  Complains of joint pain, muscle weakness, and stiffness. Neuro:  Complains of poor balance, tremors, and weakness; denies headaches, seizures, and sensation of room spinning. Psych:  Denies anxiety and  depression. Heme:  Denies abnormal bruising and bleeding. Allergy:  Denies hives or rash and sneezing.  Physical Exam  General:  Well-developed,well-nourished,in no acute distress; alert,appropriate and cooperative throughout examinationPt unable to stand withouit assistance and uses a wheelchair for mobility HEENT: No facial asymmetry,  EOMI, No sinus tenderness, TM's Clear, oropharynx  pink and moist.   Chest: Clear to auscultation bilaterally.  CVS: S1, S2, No murmurs, No S3.   Abd: Soft, Nontender.  MS: decreased  ROM spine, hips, shoulders and knees. Swelling of right calf with tenderness, homanns negative Ext: No edema.   CNS: CN 2-12 intact, power tone and sensation normal throughout.   Skin: Intact, erythema of lower ext Psych: Good eye contact, flat affect.  Memory los, not anxious or depressed appearing.    Impression & Recommendations:  Problem # 1:  SWELLING OF LIMB (ICD-729.81) Assessment Comment Only  Orders: Radiology Referral (Radiology)venous doppler to r/o dVT  Problem # 2:  PARKINSON'S DISEASE (ICD-332.0) Assessment: Unchanged  Problem # 3:  HYPERTENSION (ICD-401.9) Assessment: Unchanged  His updated medication list for this problem includes:    Atenolol 25 Mg Tabs (Atenolol) ..... One tab by mouth once daily    Lisinopril-hydrochlorothiazide 20-25 Mg Tabs (Lisinopril-hydrochlorothiazide) .Marland Kitchen... Take one tab by mouth qd  Orders: T-Basic Metabolic  Panel [id number])  BP today: 128/80 Prior BP: 122/80 (04/14/2009)  Labs Reviewed: K+: 4.8 (01/18/2009) Creat: : 0.74 (01/18/2009)   Chol: 156 (01/18/2009)   HDL: 51 (01/18/2009)   LDL: 95 (01/18/2009)   TG: 49 (01/18/2009)  Problem # 4:  DEMENTIA (ICD-294.8) Assessment: Unchanged will refer for caregiver assistance through the program offered  Complete Medication List: 1)  Atenolol 25 Mg Tabs (Atenolol) .... One tab by mouth once daily 2)  Lisinopril-hydrochlorothiazide 20-25 Mg Tabs  (Lisinopril-hydrochlorothiazide) .... Take one tab by mouth qd 3)  Avodart 0.5 Mg Caps (Dutasteride) .... Take one cap by mouth once daily 4)  Flomax 0.4 Mg Xr24h-cap (Tamsulosin hcl) .... Take one cap by mouth once daily 5)  Carbidopa-levodopa Cr 25-100 Mg Cr-tabs (Carbidopa-levodopa) .... Take one tab by mouth 4 times daily 6)  Amantadine Hcl 100 Mg Caps (Amantadine hcl) .... Take 1 cap by mouth once daily 7)  Comtan 200 Mg Tabs (Entacapone) .... Take one half tab by mouth four times a day 8)  Exelon 4.6 Mg/24hr Pt24 (Rivastigmine) .... 2 patches every morning 9)  Prilosec Otc 20 Mg Tbec (Omeprazole magnesium) .... Take 1 capsule by mouth once a day 10)  Doxycycline Hyclate 100 Mg Caps (Doxycycline hyclate) .... One tab by mouth bid 11)  Cutivate 0.05 % Crea (Fluticasone propionate) .... Apply topically two times a day prn 12)  15-46mm Hg Knee High Stockings   Other Orders: T-CBC w/Diff ([id number]) T-PT (Prothrombin Time) (29562) T-PTT ([id number])   Patient Instructions: 1)  Please schedule a follow-up appointment in 2 months. 2)  pls complete the course of antiibioitics you received in the eD. 3)  I am recommending compression hose to help reduce the swelling of your legs. 4)  You need a blood test today as well as an ultrasound of your calf to ensure you do not have a clot in your leg 5)  BMP prior to visit, ICD-9:  sat, dx cellulitis and htn 6)  CBC w/ Diff prior to visit, ICD-9: Prescriptions: 15-20MM HG KNEE HIGH STOCKINGS   #1 pair x 0   Entered by:   Everitt Amber LPN   Authorized by:   Syliva Overman MD   Signed by:   Syliva Overman MD on 06/26/2009   Method used:   Historical   RxID:   [id number]   Appended Document: F UP FROM ED pls refer Mr. Noonklester for caregiver assitance if he and his wife agree, pls explain that it is to assist the caregiver, his wife, since he suffers from memory loss because of Parkinson's disease.   Appended Document: F UP FROM  ED His wife is not interested at this time but she really appreciates the thought

## 2010-05-02 NOTE — Progress Notes (Signed)
Summary: faxed over paper work for wheelchair  Phone Note From Other Clinic   Summary of Call: I faxed paperwork over for the Wheelchair, I faxed that to [phone] and also called to speak to Jasmine December, she wasn't there today but Thayer Ohm stated that if Jasmine December needed something more she would call me tomorrow.  Initial call taken by: Curtis Sites,  March 06, 2010 2:15 PM

## 2010-05-02 NOTE — Progress Notes (Signed)
  Phone Note Other Incoming   Caller: d Emet Rafanan Summary of Call: pls provide pt with all the   Follow-up for Phone Call        already did. Patient aware Follow-up by: Everitt Amber LPN,  Aug 04, 8180 11:02 AM    1`jalyn samples we have to take ionce daily, this ios the same as his avodart and flomax, he does not takwe the jaly with those, ensure they undrstand and write it down for them also

## 2010-05-02 NOTE — Progress Notes (Signed)
Summary: [facility]  Phone Note Call from Patient   Summary of Call: PT STATES THEY ARE SENDING FORM OVER ON [facility] FOR PT Initial call taken by: Rudene Anda,  December 21, 2009 2:23 PM  Follow-up for Phone Call        noted Follow-up by: Adella Hare LPN,  December 21, 2009 3:49 PM

## 2010-05-02 NOTE — Miscellaneous (Signed)
Summary: FLU  Clinical Lists Changes  Observations: Added new observation of FLU VAX: given (12/13/2009 12:49)      Preventive Care Screening  Last Flu Shot:    Date:  12/13/2009    Results:  given     At [facility]

## 2010-05-02 NOTE — Assessment & Plan Note (Signed)
Summary: F UP   Vital Signs:  Patient profile:   77 year old male Height:      67 inches Weight:      192 pounds BMI:     30.18 O2 Sat:      94 % Pulse rate:   63 / minute Pulse rhythm:   regular Resp:     16 per minute BP sitting:   128 / 82  (left arm) Cuff size:   regular  Vitals Entered By: Everitt Amber LPN (November 15, 2009 8:20 AM)  Nutrition Counseling: Patient's BMI is greater than 25 and therefore counseled on weight management options. CC: Follow up chronic problems   Primary Care Faithlyn Recktenwald:  Syliva Overman MD  CC:  Follow up chronic problems.  History of Present Illness: Reports  that he has been oing well. Denies recent fever or chills. Denies sinus pressure, nasal congestion , ear pain or sore throat. Denies chest congestion, or cough productive of sputum. Denies chest pain, palpitations, PND, orthopnea or leg swelling. Denies abdominal pain, nausea, vomitting, diarrhea or constipation. Denies change in bowel movements or bloody stool. Denies dysuria , frequency, incontinence or hesitancy. Denies  joint pain, swelling, or reduced mobility. Denies headaches, vertigo, seizures. Denies depression, anxiety or insomnia. Denies  rash, lesions, or itch.     Current Medications (verified): 1)  Atenolol 25 Mg  Tabs (Atenolol) .... One Tab By Mouth Once Daily 2)  Lisinopril-Hydrochlorothiazide 20-25 Mg Tabs (Lisinopril-Hydrochlorothiazide) .... Take One Tab By Mouth Qd 3)  Avodart 0.5 Mg Caps (Dutasteride) .... Take One Cap By Mouth Once Daily 4)  Flomax 0.4 Mg Xr24h-Cap (Tamsulosin Hcl) .... Take One Cap By Mouth Once Daily 5)  Carbidopa-Levodopa Cr 25-100 Mg Cr-Tabs (Carbidopa-Levodopa) .... Take One Tab By Mouth 4 Times Daily 6)  Amantadine Hcl 100 Mg Caps (Amantadine Hcl) .... Take 1 Cap By Mouth Once Daily 7)  Comtan 200 Mg Tabs (Entacapone) .... Take One Half Tab By Mouth Four Times A Day 8)  Exelon 4.6 Mg/24hr Pt24 (Rivastigmine) .... 2 Patches Every  Morning 9)  Cutivate 0.05 % Crea (Fluticasone Propionate) .... Apply Topically Two Times A Day Prn 10)  15-62mm Hg Knee High Stockings 11)  Omeprazole 20 Mg Cpdr (Omeprazole) .... One Tab By Mouth Once Daily   Discontinue Prilosec  Allergies (verified): No Known Drug Allergies  Review of Systems      See HPI General:  Complains of fatigue. Eyes:  Complains of vision loss-both eyes. Neuro:  Complains of numbness. Endo:  Denies cold intolerance, excessive hunger, excessive thirst, excessive urination, heat intolerance, polyuria, and weight change. Heme:  Denies abnormal bruising and bleeding. Allergy:  Complains of seasonal allergies; denies sneezing.  Physical Exam  General:  Well-developed,well-nourished,in no acute distress; alert,appropriate and cooperative throughout examinationPt unable to stand withouit assistance and uses a wheelchair for mobility HEENT: No facial asymmetry,  EOMI, No sinus tenderness, TM's Clear, oropharynx  pink and moist.   Chest: Clear to auscultation bilaterally.  CVS: S1, S2, No murmurs, No S3.   Abd: Soft, Nontender.  MS: decreased  ROM spine, hips, shoulders and knees.  Ext: No edema.   CNS: CN 2-12 intact, power tone and sensation normal throughout.   Skin: Intact, erythema of  sebaceous cyst on his back, no buttock lesion Psych: Good eye contact, flat affect.  Memory loss, not anxious or depressed appearing.    Impression & Recommendations:  Problem # 1:  PARKINSON'S DISEASE (ICD-332.0) Assessment Comment Only stable  Problem #  2:  HYPERTENSION (ICD-401.9) Assessment: Unchanged  Reviewed preventive care protocols, scheduled due services, and updated immunizations.  His updated medication list for this problem includes:    Atenolol 25 Mg Tabs (Atenolol) ..... One tab by mouth once daily    Lisinopril-hydrochlorothiazide 20-25 Mg Tabs (Lisinopril-hydrochlorothiazide) .Marland Kitchen... Take one tab by mouth qd  BP today: 128/82 Prior BP: 122/82  (08/15/2009)  Labs Reviewed: K+: 4.4 (08/16/2009) Creat: : 0.77 (08/16/2009)   Chol: 153 (08/16/2009)   HDL: 51 (08/16/2009)   LDL: 91 (08/16/2009)   TG: 55 (08/16/2009)  Problem # 3:  SLEEP APNEA / OBSTRUCTIVE (ICD-780.57) Assessment: Comment Only  Complete Medication List: 1)  Atenolol 25 Mg Tabs (Atenolol) .... One tab by mouth once daily 2)  Lisinopril-hydrochlorothiazide 20-25 Mg Tabs (Lisinopril-hydrochlorothiazide) .... Take one tab by mouth qd 3)  Avodart 0.5 Mg Caps (Dutasteride) .... Take one cap by mouth once daily 4)  Flomax 0.4 Mg Xr24h-cap (Tamsulosin hcl) .... Take one cap by mouth once daily 5)  Carbidopa-levodopa Cr 25-100 Mg Cr-tabs (Carbidopa-levodopa) .... Take one tab by mouth 4 times daily 6)  Amantadine Hcl 100 Mg Caps (Amantadine hcl) .... Take 1 cap by mouth once daily 7)  Comtan 200 Mg Tabs (Entacapone) .... Take one half tab by mouth four times a day 8)  Exelon 4.6 Mg/24hr Pt24 (Rivastigmine) .... 2 patches every morning 9)  Cutivate 0.05 % Crea (Fluticasone propionate) .... Apply topically two times a day prn 10)  15-29mm Hg Knee High Stockings  11)  Omeprazole 20 Mg Cpdr (Omeprazole) .... One tab by mouth once daily   discontinue prilosec  Patient Instructions: 1)  Please schedule a follow-up appointment in 3 months. 2)  you look very well,Ii hope and pray that you continue do and keep well. 3)  your labwork was great in may. 4)  Continue the current meds. Prescriptions: CUTIVATE 0.05 % CREA (FLUTICASONE PROPIONATE) apply topically two times a day prn  #30gm x 1   Entered by:   Everitt Amber LPN   Authorized by:   Syliva Overman MD   Signed by:   Everitt Amber LPN on 16/01/9603   Method used:   Electronically to        [facility] (retail)       [address]       [place], [place]  [address]       Ph: [phone]       Fax: [phone]   RxID:   [id number] LISINOPRIL-HYDROCHLOROTHIAZIDE 20-25 MG TABS  (LISINOPRIL-HYDROCHLOROTHIAZIDE) Take one tab by mouth qd  #90 x 2   Entered by:   Everitt Amber LPN   Authorized by:   Syliva Overman MD   Signed by:   Everitt Amber LPN on 01/27/2535   Method used:   Electronically to        [facility] (retail)       [address]       [place], [place]  [address]       Ph: [phone]       Fax: [phone]   RxID:   [id number] ATENOLOL 25 MG  TABS (ATENOLOL) one tab by mouth once daily  #90 x 2   Entered by:   Everitt Amber LPN   Authorized by:   Syliva Overman MD   Signed by:   Everitt Amber LPN on 32/35/5732  Method used:   Electronically to        [facility] (retail)       [address]       [place], [place]  [address]       Ph: [phone]       Fax: [phone]   RxID:   [id number]

## 2010-05-02 NOTE — Progress Notes (Signed)
  Phone Note Call from Patient   Caller: Patient Summary of Call: patients wife called upset, states her insurance will not pay for a PT eval and told her all she needed was a script for the wheelchair please call her  Initial call taken by: Adella Hare LPN,  November 23, 2009 3:11 PM  Follow-up for Phone Call        pls tell her not to be crying an pls [place] , iot should be doable Follow-up by: Syliva Overman MD,  November 23, 2009 5:13 PM  Additional Follow-up for Phone Call Additional follow up Details #1::        I sent for a form Additional Follow-up by: Everitt Amber LPN,  November 24, 2009 11:36 AM

## 2010-05-02 NOTE — Progress Notes (Signed)
Summary: stockings  Phone Note Call from Patient   Summary of Call: call back at [phone] about his stockings Initial call taken by: Lind Guest,  June 30, 2009 4:20 PM  Follow-up for Phone Call        wife wants to know if he can get some new stockings for patient, has run in them.  Follow-up by: Adella Hare LPN,  June 30, 2009 4:44 PM  Additional Follow-up for Phone Call Additional follow up Details #1::        advise we will send for refill pls call this in he just got the script, this is NO guarantee that the ins will cover Additional Follow-up by: Syliva Overman MD,  June 30, 2009 5:10 PM    Additional Follow-up for Phone Call Additional follow up Details #2::    rx faxed, left detailed voicemail for wife as she requested Follow-up by: Adella Hare LPN,  June 30, 2009 5:37 PM  Prescriptions: 15-20MM HG KNEE HIGH STOCKINGS   #1 pair x 0   Entered by:   Adella Hare LPN   Authorized by:   Syliva Overman MD   Signed by:   Adella Hare LPN on 21/30/8657   Method used:   Printed then faxed to ...       [facility] (retail)       [address]       [place], [place]  [address]       Ph: [phone]       Fax: [phone]   RxID:   [id number]

## 2010-05-02 NOTE — Progress Notes (Signed)
Summary: speak with nurse  Phone Note Call from Patient   Summary of Call: needs to speak with nurse about blood pressure. [phone] Initial call taken by: Rudene Anda,  December 08, 2009 9:55 AM  Follow-up for Phone Call        Earl Hayes is concerned because his pulse is supposed to stay in the 50's with the Atenolol.  It has been running in the 50's but last week it started to increase. It has been 67 today and his BP today was 115/78 119/79 pulse 80, 137/80 pulse 52 124/75, 64 121/75, 67 over the past few days. What does she need to do for him? Is this dangerous? Follow-up by: Everitt Amber LPN,  December 08, 2009 1:23 PM  Additional Follow-up for Phone Call Additional follow up Details #1::        the pulse rates she is giving are not dangeropus, she can bring him one day for an ekg nurse visit only for that, let her know.pls ask anddocument if he is light headed or if he feels well. tell her i sent in for the power wheelchair [pls Additional Follow-up by: Syliva Overman MD,  December 08, 2009 5:10 PM    Additional Follow-up for Phone Call Additional follow up Details #2::    returned call, no answer Follow-up by: Adella Hare LPN,  December 08, 2009 5:16 PM  Additional Follow-up for Phone Call Additional follow up Details #3:: Details for Additional Follow-up Action Taken: states her insurance will not pay for [employer] has to go through different company Additional Follow-up by: Adella Hare LPN,  December 09, 2009 10:47 AM  pls refax the script and the PT eval to the company she is requesting , let [facility] know not to continue with this based on pt's statement and let her know that you have done this  patients wife does not know the name of the company or any info on the company, states they faxed [place] but we have no such info. cannot proceed until patient's wife provides info on company

## 2010-05-02 NOTE — Letter (Signed)
Summary: wheelchair  wheelchair   Imported By: Lind Guest 12/15/2009 09:46:38  _____________________________________________________________________  External Attachment:    Type:   Image     Comment:   External Document

## 2010-05-02 NOTE — Progress Notes (Signed)
  Phone Note From Pharmacy   Caller: [facility] Summary of Call: insurance does not cover otc prilosec, is it ok to change to regular omeprazole? Initial call taken by: Adella Hare LPN,  June 30, 2009 4:49 PM  Follow-up for Phone Call        change to omeprazole 20mg  one daily and erx #30 refill 3 Follow-up by: Syliva Overman MD,  June 30, 2009 5:10 PM  Additional Follow-up for Phone Call Additional follow up Details #1::        Prescription resent Additional Follow-up by: Adella Hare LPN,  June 30, 2009 5:30 PM    New/Updated Medications: OMEPRAZOLE 20 MG CPDR (OMEPRAZOLE) one tab by mouth once daily   discontinue prilosec Prescriptions: OMEPRAZOLE 20 MG CPDR (OMEPRAZOLE) one tab by mouth once daily   discontinue prilosec  #30 x 3   Entered by:   Adella Hare LPN   Authorized by:   Syliva Overman MD   Signed by:   Adella Hare LPN on 16/01/9603   Method used:   Electronically to        [facility] (retail)       [address]       [place], [place]  [address]       Ph: [phone]       Fax: [phone]   RxID:   [id number]

## 2010-05-02 NOTE — Progress Notes (Signed)
Summary: call back  Phone Note Call from Patient   Summary of Call: left message needs to talk to brandi about [facility] call her back Initial call taken by: Lind Guest,  January 19, 2010 11:17 AM  Follow-up for Phone Call        patients wife wants to make sure dr simpson completes forms faxed over for wheelchair, she [place] get [place] from [place], forms were placed in box this week Follow-up by: Adella Hare LPN,  January 20, 2010 3:29 PM  Additional Follow-up for Phone Call Additional follow up Details #1::        i do have it and will fill out form pls let her know Additional Follow-up by: Syliva Overman MD,  January 24, 2010 5:18 PM    Additional Follow-up for Phone Call Additional follow up Details #2::    patient wife aware Follow-up by: Adella Hare LPN,  January 25, 2010 8:43 AM

## 2010-05-02 NOTE — Letter (Signed)
Summary: [facility]  WHELCHAIR   Imported By: Lind Guest 03/06/2010 15:08:00  _____________________________________________________________________  External Attachment:    Type:   Image     Comment:   External Document

## 2010-05-04 ENCOUNTER — Telehealth: Payer: Self-pay | Admitting: Family Medicine

## 2010-05-04 NOTE — Progress Notes (Signed)
Summary: red spots on legs  Phone Note Call from Patient   Summary of Call: pt has red spots on his leg up to knee. would like to speak with nurse about this. No swelling [phone] Initial call taken by: Rudene Anda,  April 17, 2010 2:00 PM  Follow-up for Phone Call        states round red spots, quarter sized, on both legs, do not itch or burning Follow-up by: Adella Hare LPN,  April 17, 2010 3:55 PM  Additional Follow-up for Phone Call Additional follow up Details #1::        If no drainage from the spots, trial of hydrocortisone cream which is otc twice daily for this week, if worsens or persits , needs ov Additional Follow-up by: Syliva Overman MD,  April 17, 2010 5:14 PM    Additional Follow-up for Phone Call Additional follow up Details #2::    patient wife aware  Additional Follow-up for Phone Call Additional follow up Details #3:: Details for Additional Follow-up Action Taken: exelon samples given x8 [id number] 3/13 Adella Hare LPN  April 18, 2010 9:29 AM\par Additional Follow-up by: Adella Hare LPN,  April 18, 2010 9:29 AM

## 2010-05-10 NOTE — Assessment & Plan Note (Signed)
Summary: ov   Vital Signs:  Patient profile:   77 year old male Height:      67 inches O2 Sat:      96 % on Room air Pulse rate:   58 / minute Resp:     16 per minute BP sitting:   100 / 70  (left arm)  Vitals Entered By: Adella Hare LPN (April 28, 2010 9:01 AM)  O2 Flow:  Room air CC: follow-up visit Is Patient Diabetic? No Comments did not bring meds to ov   Primary Care Provider:  Syliva Overman MD  CC:  follow-up visit.  History of Present Illness: Reports  thathe has been doing fairly well. Denies recent fever or chills. Denies sinus pressure, nasal congestion , ear pain or sore throat. Denies chest congestion, or cough productive of sputum. Denies chest pain, palpitations, PND, orthopnea or leg swelling. Denies abdominal pain, nauseac/o brown non puritic rash  vomitting, diarrhea or constipation. Denies change in bowel movements or bloody stool. Denies dysuria , frequency, incontinence or hesitancy. significant back stiffness, with reduced mobility, has got the power wheelchair, now trying to get a vehicle to put it in. Denies headaches, vertigo, seizures. Denies depression, anxiety or insomnia. Reports brown non puritic rash on thighs x 3 weeks   Allergies (verified): No Known Drug Allergies  Review of Systems      See HPI General:  Complains of fatigue. Eyes:  Complains of vision loss-both eyes; denies blurring and discharge. MS:  Complains of joint pain, low back pain, mid back pain, muscle weakness, and stiffness. Derm:  Complains of lesion(s) and rash. Neuro:  Complains of falling down, memory loss, poor balance, visual disturbances, and weakness. Endo:  Denies cold intolerance, excessive hunger, excessive thirst, excessive urination, and heat intolerance. Heme:  Denies abnormal bruising and bleeding. Allergy:  Complains of seasonal allergies.  Physical Exam  General:  Well-developed,well-nourished,in no acute distress; alert,appropriate and  cooperative throughout examinationPt unable to stand withouit assistance and uses a wheelchair for mobility. HEENT: No facial asymmetry,  EOMI, No sinus tenderness, TM's Clear, oropharynx  pink and moist. neck; decreased ROM with trapezius spasm  Chest: Clear to auscultation bilaterally.  CVS: S1, S2, No murmurs, No S3.   Abd: Soft, Nontender.  MS: decreased  ROM spine, hips, shoulders and knees.  Ext: No edema.   CNS: CN 2-12 intact,reduced power in both upper and lower extremities, sensation  intact, tremor  noted in uper extremities. Pt hasabnormal gait, his coordination and balance are impaired, finger nose testing is abnormal.  Skin: Intact, brown macular rash on anterior thighs,blanches with pressure Psych: Good eye contact, flat affect.  Memory loss, not anxious or depressed appearing.    Impression & Recommendations:  Problem # 1:  GERD (ICD-530.81) Assessment Comment Only  His updated medication list for this problem includes:    Omeprazole 20 Mg Cpdr (Omeprazole) ..... One tab by mouth once daily   discontinue prilosec  Problem # 2:  ECCHYMOSES (ICD-782.9) Assessment: Comment Only pt and spouse reassured re benign nature of rash  Problem # 3:  SLEEP APNEA / OBSTRUCTIVE (ICD-780.57) Assessment: Deteriorated followed by neurology, continue meds  Problem # 4:  ABNORMALITY OF GAIT (ICD-781.2) Assessment: Deteriorated will attemp to assist with obtaining transpt by calling a local dealership on pt's behalf  Complete Medication List: 1)  Atenolol 25 Mg Tabs (Atenolol) .... One tab by mouth once daily 2)  Lisinopril-hydrochlorothiazide 20-25 Mg Tabs (Lisinopril-hydrochlorothiazide) .... Take one tab by mouth qd  3)  Avodart 0.5 Mg Caps (Dutasteride) .... Take one cap by mouth once daily 4)  Flomax 0.4 Mg Xr24h-cap (Tamsulosin hcl) .... Take one cap by mouth once daily 5)  Carbidopa-levodopa Cr 25-100 Mg Cr-tabs (Carbidopa-levodopa) .... Take one tab by mouth 4 times daily 6)   Amantadine Hcl 100 Mg Caps (Amantadine hcl) .... Take 1 cap by mouth once daily 7)  Comtan 200 Mg Tabs (Entacapone) .... Take one half tab by mouth four times a day 8)  Exelon 4.6 Mg/24hr Pt24 (Rivastigmine) .... 2 patches every morning 9)  Cutivate 0.05 % Crea (Fluticasone propionate) .... Apply topically two times a day prn 10)  15-72mm Hg Knee High Stockings  11)  Omeprazole 20 Mg Cpdr (Omeprazole) .... One tab by mouth once daily   discontinue prilosec 12)  Clotrimazole-betamethasone 1-0.05 % Crea (Clotrimazole-betamethasone) .... Apply twice daily to affected area x 10 days, then as needed 13)  Pedi-dri 100000 Unit/gm Powd (Nystatin) .... Apply twice daily to affected areas for 10 days, then as needed  Other Orders: T-Basic Metabolic Panel [phone]) T-Lipid Profile [phone]) T-CBC w/Diff [id number]) T-TSH [id number])  Patient Instructions: 1)  Please schedule a follow-up appointment in 4. months. 2)  BMP prior to visit, ICD-9: 3)  Lipid Panel prior to visit, ICD-9: 4)  TSH prior to visit, ICD-9:   fasting in 4 months 5)  CBC w/ Diff prior to visit, ICD-9: 6)  I do not see any bad pathology on the skin of your legs, no need to do anything different   Orders Added: 1)  Est. Patient Level IV [28413] 2)  T-Basic Metabolic Panel [[id number]] 3)  T-Lipid Profile [[id number]] 4)  T-CBC w/Diff [[id number]] 5)  T-TSH [[id number]]

## 2010-05-10 NOTE — Progress Notes (Signed)
  Phone Note Call from Patient   Caller: Patient Summary of Call: patients wife called and states his groin is really raw, has been keeping clean and dry, what can she put on this? is asking about hydrocortisone cream Initial call taken by: Adella Hare LPN,  May 04, 2010 9:38 AM  Follow-up for Phone Call        pls advise  i will send in a cream and powder then send the historivc entry.  pls let her know I called [facility], they say may be able to help, she needs to call [phone], mention my name that they are following up on the call i made on their behalf, and see where it goes from there Follow-up by: Syliva Overman MD,  May 04, 2010 1:39 PM  Additional Follow-up for Phone Call Additional follow up Details #1::        Patient aware Additional Follow-up by: Everitt Amber LPN,  May 04, 2010 1:46 PM    New/Updated Medications: CLOTRIMAZOLE-BETAMETHASONE 1-0.05 % CREA (CLOTRIMAZOLE-BETAMETHASONE) apply twice daily to affected area x 10 days, then as needed PEDI-DRI 100000 UNIT/GM POWD (NYSTATIN) apply twice daily to affected areas for 10 days, then as needed Prescriptions: PEDI-DRI 100000 UNIT/GM POWD (NYSTATIN) apply twice daily to affected areas for 10 days, then as needed  #60gm x 1   Entered by:   Everitt Amber LPN   Authorized by:   Syliva Overman MD   Signed by:   Everitt Amber LPN on 11/91/4782   Method used:   Electronically to        [facility] (retail)       [address]       [place], [place]  [address]       Ph: [phone]       Fax: [phone]   RxID:   [id number] CLOTRIMAZOLE-BETAMETHASONE 1-0.05 % CREA (CLOTRIMAZOLE-BETAMETHASONE) apply twice daily to affected area x 10 days, then as needed  #45gm x 0   Entered by:   Everitt Amber LPN   Authorized by:   Syliva Overman MD   Signed by:   Everitt Amber LPN on 47/42/5956   Method used:   Electronically to        [facility] (retail)       [address]       [place], [place]  [address]       Ph: [phone]       Fax: [phone]   RxID:   [id number]

## 2010-06-07 ENCOUNTER — Encounter: Payer: Self-pay | Admitting: Family Medicine

## 2010-06-13 ENCOUNTER — Encounter: Payer: Self-pay | Admitting: Family Medicine

## 2010-06-20 ENCOUNTER — Other Ambulatory Visit: Payer: Self-pay | Admitting: Family Medicine

## 2010-06-20 LAB — CBC WITH DIFFERENTIAL/PLATELET
Basophils Relative: 0 % (ref 0–1)
Eosinophils Absolute: 0 10*3/uL (ref 0.0–0.7)
Eosinophils Relative: 1 % (ref 0–5)
Lymphs Abs: 0.9 10*3/uL (ref 0.7–4.0)
MCH: 30.6 pg (ref 26.0–34.0)
MCHC: 34 g/dL (ref 30.0–36.0)
MCV: 89.8 fL (ref 78.0–100.0)
Monocytes Relative: 16 % — ABNORMAL HIGH (ref 3–12)
Platelets: 203 10*3/uL (ref 150–400)
RBC: 5.2 MIL/uL (ref 4.22–5.81)

## 2010-06-20 LAB — BASIC METABOLIC PANEL
CO2: 26 mEq/L (ref 19–32)
Calcium: 9.2 mg/dL (ref 8.4–10.5)
Creat: 0.7 mg/dL (ref 0.40–1.50)
Glucose, Bld: 88 mg/dL (ref 70–99)

## 2010-06-20 LAB — TSH: TSH: 0.838 u[IU]/mL (ref 0.350–4.500)

## 2010-06-20 LAB — LIPID PANEL: Cholesterol: 154 mg/dL (ref 0–200)

## 2010-06-20 NOTE — Letter (Signed)
[facility]: [facility]   Imported By: Lind Guest 06/13/2010 14:44:50  _____________________________________________________________________  External Attachment:    Type:   Image     Comment:   External Document

## 2010-06-21 ENCOUNTER — Encounter: Payer: Self-pay | Admitting: Family Medicine

## 2010-06-21 ENCOUNTER — Ambulatory Visit (INDEPENDENT_AMBULATORY_CARE_PROVIDER_SITE_OTHER): Payer: MEDICARE | Admitting: Family Medicine

## 2010-06-21 VITALS — BP 118/74 | HR 58 | Resp 16 | Ht 66.0 in | Wt 200.1 lb

## 2010-06-21 DIAGNOSIS — G20 Parkinson's disease: Secondary | ICD-10-CM

## 2010-06-21 DIAGNOSIS — I10 Essential (primary) hypertension: Secondary | ICD-10-CM

## 2010-06-21 DIAGNOSIS — G20A1 Parkinson's disease without dyskinesia, without mention of fluctuations: Secondary | ICD-10-CM

## 2010-06-21 DIAGNOSIS — F0390 Unspecified dementia without behavioral disturbance: Secondary | ICD-10-CM

## 2010-06-21 MED ORDER — FLUTICASONE PROPIONATE 0.05 % EX CREA: TOPICAL_CREAM | Freq: Two times a day (BID) | CUTANEOUS | Status: DC

## 2010-06-21 MED ORDER — OMEPRAZOLE 20 MG PO TBEC: 20.0000 mg | DELAYED_RELEASE_TABLET | Freq: Every day | ORAL | Status: DC

## 2010-06-21 NOTE — Patient Instructions (Signed)
F/u in 4 months.  Your labs are GREAT, pls cut back water intake to  A max of  70 ounces of water daily.  Please start adding a little salt to your beans, your sodium was a little low.  Keep active, and continue to eat healthily. pls aim for at least 7 hours sleep each night.  We will try to get patches for you

## 2010-07-10 NOTE — Progress Notes (Signed)
  Subjective:    Patient ID: Earl Hayes, male    DOB: [birth year], 76 y.o.   MRN: [id number] The patient  is here for follow up and re-evaluation of chronic medical conditions, medication management and review of recent lab  data.  Preventive health is updated, specifically  Cancer screening,  and Immunization.   Questions or concerns regarding consultations or procedures which the PT has had in the interim are  addressed. The PT denies any adverse reactions to current medications since the last visit.  Earl Hayes is currently alone at home while his wife recovers from hip surgery. His son who accompanies him states he is doing extremely well, and is much more independentthan previously thought. Earl Hayes has been to the neurologist since his last visit , and his Parkinson's is stable.He has had no falls.  Hypertension This is a chronic problem. The current episode started more than 1 year ago. The problem is unchanged. Past treatments include diuretics and angiotensin blockers. The current treatment provides significant improvement.      Review of Systems Denies recent fever or chills. Denies sinus pressure, nasal congestion, ear pain or sore throat. Denies chest congestion, productive cough or wheezing. Denies chest pains, palpitations, paroxysmal nocturnal dyspnea, orthopnea and leg swelling Denies abdominal pain, nausea, vomiting,diarrhea or constipation.  Denies rectal bleeding or change in bowel movement. Denies dysuria, frequency, hesitancy or incontinence. Reports chronic  joint pain, swelling and limitation in mobility. Denies headaches, seizure, numbness, or tingling. Denies depression, anxiety or insomnia. Denies skin break down or rash.        Objective:   Physical Exam Patient alert and oriented and in no Cardiopulmonary distress.  HEENT: No facial asymmetry, EOMI, no sinus tenderness, TM's clear, Oropharynx pink and moist.  Neck decreased range of  motion, no adenopathy.  Chest: Clear to auscultation bilaterally.  CVS: S1, S2 no murmurs, no S3.  ABD: Soft non tender. Bowel sounds normal.  Ext: No edema  MS: decreased  ROM spine, shoulders, hips and knees.Pt has very limited mobility and is wheelchair bound  Skin: Intact, no ulcerations or rash noted.  Psych: Good eye contact, flat  affect. Memory intact not anxious or depressed appearing.  CNS: CN 2-12 intact, power decreased in upper and lower ext, increased tone with cog wheeling in  Upper extremities, sensation intact.       Assessment & Plan:  1. Parkinson's disease: unchanged, pt to continue med and management through neurology. 2. Hypertension:Controlled, no changes in medication.  3. Dementia: continue exelon, management is by neurology also.

## 2010-07-11 ENCOUNTER — Encounter: Payer: Self-pay | Admitting: Family Medicine

## 2010-07-26 ENCOUNTER — Other Ambulatory Visit: Payer: Self-pay | Admitting: Family Medicine

## 2010-07-26 ENCOUNTER — Telehealth: Payer: Self-pay | Admitting: Family Medicine

## 2010-07-26 NOTE — Telephone Encounter (Signed)
Aware and will collect

## 2010-07-31 ENCOUNTER — Telehealth: Payer: Self-pay | Admitting: Family Medicine

## 2010-07-31 DIAGNOSIS — R05 Cough: Secondary | ICD-10-CM

## 2010-07-31 NOTE — Telephone Encounter (Signed)
I advise a CXR to ensure no problem in his lungs , if this is normal then a GI referral,Iplsll order the cxr, 2 views after speaking with her

## 2010-08-01 ENCOUNTER — Telehealth: Payer: Self-pay | Admitting: Family Medicine

## 2010-08-01 NOTE — Telephone Encounter (Signed)
Advised already sent

## 2010-08-01 NOTE — Telephone Encounter (Signed)
Ruby aware and order sent

## 2010-08-04 ENCOUNTER — Ambulatory Visit (HOSPITAL_COMMUNITY)
Admission: RE | Admit: 2010-08-04 | Discharge: 2010-08-04 | Disposition: A | Discharge status: Home / Self Care | Payer: MEDICARE | Source: Ambulatory Visit | Attending: Family Medicine | Admitting: Family Medicine

## 2010-08-04 ENCOUNTER — Telehealth: Payer: Self-pay | Admitting: Family Medicine

## 2010-08-04 DIAGNOSIS — R9389 Abnormal findings on diagnostic imaging of other specified body structures: Secondary | ICD-10-CM

## 2010-08-04 DIAGNOSIS — R059 Cough, unspecified: Secondary | ICD-10-CM | POA: Insufficient documentation

## 2010-08-04 DIAGNOSIS — R05 Cough: Secondary | ICD-10-CM | POA: Insufficient documentation

## 2010-08-04 DIAGNOSIS — R918 Other nonspecific abnormal finding of lung field: Secondary | ICD-10-CM | POA: Insufficient documentation

## 2010-08-04 NOTE — Telephone Encounter (Signed)
Wife aware of need for further testing

## 2010-08-08 ENCOUNTER — Telehealth: Payer: Self-pay | Admitting: Family Medicine

## 2010-08-08 DIAGNOSIS — I10 Essential (primary) hypertension: Secondary | ICD-10-CM

## 2010-08-08 NOTE — Telephone Encounter (Signed)
Message copied by Adella Hare on Tue Aug 08, 2010  9:48 AM ------      Message from: Syliva Overman      Created: Fri Aug 04, 2010  6:30 PM       pls order creatinine on this pt if requested for chest ctscan

## 2010-08-14 ENCOUNTER — Ambulatory Visit (HOSPITAL_COMMUNITY)
Admission: RE | Admit: 2010-08-14 | Discharge: 2010-08-14 | Disposition: A | Discharge status: Home / Self Care | Payer: MEDICARE | Source: Ambulatory Visit | Attending: Family Medicine | Admitting: Family Medicine

## 2010-08-14 ENCOUNTER — Telehealth: Payer: Self-pay | Admitting: *Deleted

## 2010-08-14 DIAGNOSIS — R918 Other nonspecific abnormal finding of lung field: Secondary | ICD-10-CM | POA: Insufficient documentation

## 2010-08-14 DIAGNOSIS — R059 Cough, unspecified: Secondary | ICD-10-CM | POA: Insufficient documentation

## 2010-08-14 DIAGNOSIS — I10 Essential (primary) hypertension: Secondary | ICD-10-CM

## 2010-08-14 DIAGNOSIS — R05 Cough: Secondary | ICD-10-CM | POA: Insufficient documentation

## 2010-08-14 DIAGNOSIS — R9389 Abnormal findings on diagnostic imaging of other specified body structures: Secondary | ICD-10-CM

## 2010-08-14 MED ORDER — IOHEXOL 300 MG/ML  SOLN: 80.0000 mL | Freq: Once | INTRAMUSCULAR | Status: AC | PRN

## 2010-08-14 NOTE — Telephone Encounter (Signed)
Orders only

## 2010-08-15 NOTE — Assessment & Plan Note (Signed)
[facility]                       [place] CARDIOLOGY OFFICE NOTE   Earl, Hayes                  MRN:          [id number]  DATE:08/21/2007                            DOB:          10/09/1928    Earl Hayes returns for follow-up.  He has had hyperlipidemia and  hypertension with 70% LAD lesion by cath in 2005.  His last Myoview in  December 2008, was nonischemic.  He does not have any significant chest  pain.  His blood pressure is well-controlled only on atenolol.  He was  previously on DiaBeta and HCTZ, this was stopped.  However, he has been  having increasing lower extremity edema.  I think he probable needs to  back on a diuretic.   He denied any significant chest pain, palpitations, PND or orthopnea.  His activity is primarily limited by his Parkinson's disease.  He sees  Dr. Sandria Manly for this.   He has had a left carotid bruit.  He had a carotid duplex study done in  [facility] yesterday which showed 0-39% bilateral stenoses.  He has not  had any significant TIA like symptoms.  He is taking a baby aspirin a  day.   He was previously on Crestor for his hypercholesterolemia, but his last  LDL cholesterol was only 85 off meds with diet therapy only.   REVIEW OF SYSTEMS:  Otherwise negative.   PHYSICAL EXAMINATION:  GENERAL:  Remarkable for an elderly white male in  no distress.  VITAL SIGNS:  Weight is 193, blood pressure is 128/70, pulse 64 and  regular, respiratory rate 14, afebrile.  HEENT:  Unremarkable.  NECK:  Carotids have a left bruit.  No JVP elevation, lymphadenopathy or  thyromegaly.  LUNGS:  Clear.  Good diaphragmatic motion.  No wheezing.  CARDIOVASCULAR:  S1-S2.  Normal heart sounds.  PMI normal.  ABDOMEN:  Benign.  Bowel sounds positive.  No AAA, no tenderness.  No  bruit.  No hepatosplenomegaly.  No hepatojugular reflux.  EXTREMITIES:  Distal pulses are intact with +1-2 lower extremity edema  bilaterally, just in the  ankles.  NEURO:  Remarkable for a cogwheeling  and large oscillating tremor in the right hand.   IMPRESSION:  1. Coronary artery disease.  No chest pain.  Continue medical therapy      with baby aspirin and beta-blocker.  Follow-up Myoview in a year.  2. Hypercholesterolemia, currently well diet controlled.  LDL in the      80 range.  No need for recurrent statin.  3. Hypertension, currently well-controlled.  Continue low-dose      atenolol.  4. Lower extremity edema.  Low-salt diet, institute      hydrochlorothiazide 25 mg every other day.      Follow up potassium and BMET in 6 months.  5. Left carotid bruit.  Follow-up carotid duplex in 2 years.   I will see the patient back in [place] in 52-months.     [name]. Eden Emms, MD, [facility]  Electronically Signed    PCN/MedQ  DD: 08/21/2007  DT: 08/21/2007  Job #: [id number]

## 2010-08-15 NOTE — Op Note (Signed)
NAME:  Earl Hayes, Earl Hayes NO.:  [id number]   MEDICAL RECORD NO.:  [id number]          PATIENT TYPE:  AMB   LOCATION:  DAY                           FACILITY:  [facility]   PHYSICIAN:  Kassie Mends, M.D.      DATE OF BIRTH:  July 11, 1928   DATE OF PROCEDURE:  01/30/2007  DATE OF DISCHARGE:                               OPERATIVE REPORT   PROCEDURE:  Colonoscopy.   INDICATION FOR EXAM:  Earl Hayes is a 77 year old male who presents  for average risk colon cancer screening.   FINDINGS:  1. Tortuous sigmoid colon which made intubating the cecum challenging.      A large amount of liquid stool with particulate matter aspirated      from the colon.  Otherwise, no polyps, masses, inflammatory      changes, AVMs or diverticula seen.  2. Normal retroflexed view of the rectum   RECOMMENDATIONS:  1. Screening colonoscopy in ten years if he remains healthy.  2. High fiber diet.  He is given a handout on high fiber diet.   MEDICATIONS:  1. Demerol 75 mg IV.  2. Versed 3 mg IV.   PROCEDURE TECHNIQUE:  Physical exam was performed and consent was  obtained from the patient after explaining the benefits, risks and  alternatives to the procedure.  The patient was connected to the monitor  and placed in the left lateral position.  Continuous oxygen was provided  by nasal cannula and IV medicine administered through an indwelling  cannula.  After administration of sedation and rectal exam, the  patient's rectum was intubated.  The scope was advanced under direct visualization to the cecum.  The  scope was removed slowly by carefully examining the color, texture,  anatomy and integrity of the mucosa on the way out.  The patient was  recovered in endoscopy and discharged home in satisfactory condition.      Kassie Mends, M.D.  Electronically Signed     SM/MEDQ  D:  01/30/2007  T:  01/30/2007  Job:  [id number]   cc:   Milus Mallick. Lodema Hong, M.D.  Fax: [phone]

## 2010-08-15 NOTE — Assessment & Plan Note (Signed)
[facility]                            CARDIOLOGY OFFICE NOTE   JUD, FANGUY                  MRN:          [id number]  DATE:03/02/2008                            DOB:          11/03/1928    Earl Hayes returns today for followup.  He has a history of moderate  coronary artery disease by cath in December 2007.  He had a 77 LAD  lesion.  In the last December, he had a Myoview which was nonischemic.  He is doing well.  He is not having any significant chest pain.  He has  significant tremors in the right upper extremity.  He was a bit sad  today.  His wife broke her hip about 3 weeks ago and is having a slow  recovery.  They have been married for 42 years and he is fairly  dependent on her.  Otherwise, he is doing well.  He has not had any  significant exertional dyspnea.  He continues to have some orthopedic  limitations.  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   CURRENT MEDICATIONS:  1. Flomax 0.4 a day.  2. Avodart 0.5 a day.  3. Carbidopa  4. Amantadine 100 mg a day.  5. Hydrochlorothiazide 25 a day.  6. Atenolol 25 a day.   PHYSICAL EXAMINATION:  GENERAL:  Remarkable for somewhat of a masked  facies.  VITAL SIGNS:  Weight is 203; blood pressure 143/85; pulse 53 and  regular; and respiratory rate 14, afebrile.  HEENT:  Unremarkable.  NECK:  I cannot hear any carotid bruits.  No lymphadenopathy,  thyromegaly, or JVP elevation.  LUNGS:  Clear, good diaphragmatic motion.  No wheezing.  CARDIAC:  S1 and S2.  Normal heart sounds.  PMI normal.  ABDOMEN:  Benign.  Bowel sounds positive.  No AAA.  No tenderness.  No  bruit.  No hepatosplenomegaly.  No hepatojugular reflux.  EXTREMITIES:  Distal pulses are intact.  No edema.  NEURO:  Nonfocal.  SKIN:  Warm and dry.  MUSCULOSKELETAL:  No muscular weakness.  He has a cogwheel-type tremor  in the  right upper extremity.   IMPRESSION:  1. Coronary artery disease, moderate in the left anterior descending.      No chest pain.  No need for followup stress testing at this time.      Continue baby aspirin and beta-blocker.  2. Hypertension, currently well controlled.  Continue current dose of      beta-blocker and diuretic.  3. Significant prostatism.  Continue Avodart and Flomax.  Urinary      stream is fine.  Check prostate-specific antigen in 6 months.  4. Significant Parkinson disease.  Continue current dose of carbidopa      and amantadine.  Followup with Neurology.  5. Previous history of bruit in the neck.  I cannot hear it.  He had a      carotid duplex on Aug 20, 2007, which showed 0-39% bilateral stenoses.  He does not need a  followup cardiac duplex for the next 2-3 years.     [name]. Eden Emms, MD, [facility]  Electronically Signed    PCN/MedQ  DD: 03/02/2008  DT: 03/03/2008  Job #: [id number]

## 2010-08-15 NOTE — Assessment & Plan Note (Signed)
[facility]                       [place] CARDIOLOGY OFFICE NOTE   JUANDIEGO, KOLENOVIC                  MRN:          [id number]  DATE:09/20/2006                            DOB:          [birth year]    Mr. Benedicto returns today for followup.   I last saw him in December of 2007. He has moderate coronary artery  disease in his LAD. His ejection fraction is 53%. His risk factors are  fairly well modified.   The patient's last Myoview study was in May of 2007. It was considered  low risk with no significant ischemia and just mild inferior apical wall  scarring. The patient is not all that active. He seems a little unstable  on his feet but he has not had any significant chest pain, PND, or  orthopnea. There has been no exertional dyspnea.   He has been compliant with his meds.   REVIEW OF SYSTEMS:  Otherwise negative.   MEDICATIONS:  Include;  1. Diovan/hydrochlorothiazide 80/12.5.  2. Atenolol 25 at night.  3. Exelon patches.   PHYSICAL EXAMINATION:  Remarkable for elderly appearing white male with  a somewhat subdued affect. Blood pressure is 122/72, pulse is 64 and  regular. He is afebrile. Respiratory rate is 12, weight is 192.  HEENT: Normal. There is a faint left carotid bruits. HEENT is otherwise  unremarkable. No thyromegaly. No lymphadenopathy. No JVP elevation.  LUNGS: Clear. No wheezes. Normal diaphragmatic motion.  There is an S1, S2 with normal heart sounds. PMI is normal.  Bowel sounds are positive. Abdomen is benign. There is no tenderness. No  hepatosplenomegaly or hepatojugular reflux.  Femorals are +3 bilaterally with no bruits. PT s are +3. There is trace  lower extremity edema with superficial varicosities.  NEURO: Nonfocal. There is muscular weakness.   Electrocardiogram is essentially normal with left axis deviation.   IMPRESSION:  1. Stable moderate LAD disease by catheterization in 2005. Non      ischemic  Myoview. Follow up Myoview in December. Continue aspirin      and beta blocker.  2. Hypertension, currently well controlled on      Diovan/hydrochlorothiazide. Continue low salt diet.  3. Varicosities with lower extremity edema, stable with low diuretic      dose at 12.5 mg of hydrochlorothiazide that he takes with his      Diovan.  4. Left carotid bruits, recent carotid Duplex May of this year with no      significant stenoses. Follow up in 2 years.   I will have to see why he is not on a statin drug and check his latest  LDL.   Other than this I think he is doing fine and I will see him in December  when he has his stress test.     Theron Arista C. Eden Emms, MD, [facility]  Electronically Signed    PCN/MedQ  DD: 09/20/2006  DT: 09/20/2006  Job #: [id number]

## 2010-08-18 NOTE — Op Note (Signed)
NAME:  Earl Hayes, Earl Hayes NO.:  [id number]   MEDICAL RECORD NO.:  [id number]          PATIENT TYPE:  AMB   LOCATION:  DAY                          FACILITY:  [facility]   PHYSICIAN:  Maretta Bees. Vonita Moss, M.D.DATE OF BIRTH:  06/02/1928   DATE OF PROCEDURE:  11/14/2005  DATE OF DISCHARGE:                                 OPERATIVE REPORT   PREOPERATIVE DIAGNOSIS:  Erectile dysfunction.   POSTOPERATIVE DIAGNOSIS:  Erectile dysfunction.   PROCEDURE DONE:  AMS inflatable penile prosthesis insertion (18 cm x 12 mm  cylinder with 1 cm posterior extender).   SURGEON:  Maretta Bees. Vonita Moss, M.D.   ASSISTANT:  Cornelious Bryant, M.D.   ANESTHESIA:  General.   ESTIMATED BLOOD LOSS:  50-100 mL.   COMPLICATIONS:  None.   INDICATIONS:  Mr. Sofranko is a 77 year old gentleman who has a history of  erectile dysfunction.  After extensive counseling the patient elected for  insertion of penile prosthesis.  The patient was seen and evaluated by his  cardiologist, which deemed him stable from a cardiac standpoint to undergo  surgery.   DESCRIPTION OF PROCEDURE:  The patient was brought to the operating room.  Preoperative antibiotics were given.  A time-out was taken to properly  identify the patient and procedure to be done.  General anesthesia was  induced.  The patient was placed in a supine position. His penile and  perineal area were clipped.  The patient underwent a 10-minute Betadine  scrub.  His perineal area was then prepped and draped in the normal sterile  fashion.  A midline scrotal incision was done longitudinally.  Dissection  was carried down to the skin and dartos to the levels of the tunica of both  corpus cavernosa.  Prior to incision, a 16 French Foley catheter was  inserted into the bladder and the bladder was drained.  During the rest of  the procedure the Foley catheter was used as a guide to help avoid the  urethra during dissection, manipulation and insertion of  the prosthesis.  Corporotomy 2 cm incisions were made on the corpora cavernosa.  After that,  2-0 Vicryl stitches were put on either side of the corporotomy incisions.  Each of the corpora were serially dilated using Hegar dilators from 7 [place]  up to 13 [place].  The 14 French Hegar was a bit too tight for proximal  dilation; however, it did dilate distally with ease.  The corpora were each  measured proximally and distally using the metallic rod and both corpora  measured 9 cm proximally and 10 cm distally, giving a total length of 19 cm  bilaterally.  A decision was then made to use an 18 cm cylinder with a 1 cm  posterior extender.  Using Furlow needles, the distal protrusions of the  cylinders were inserted into the corpora cavernosa.  After putting the 1 cm  cuffs on the posterior aspects of the cylinders, the proximal aspects of the  cylinders were then inserted into the proximal corpora cavernosa using the  blue pusher.  The cylinders were then inflated with a pump, achieving  adequate straight  erection with no evidence of kinking of the tube or  bulging through the corporotomy incisions.  The cylinders were then  deflated.  The right retropubic space was then entered through dissection  (protecting the cord) as blunt dissection was made to the superior level of  the superior pubic ramus.  The retropubic space was then entered using right  angle after the bladder was fully drained.  Following entry into the  retropubic space, the space was then developed bluntly using finger  dissection.  A 65 mL reservoir was then inserted into the right retropubic  space.  The same blunt dissection was then created on the scrotum, where the  pump was then put in place, the pump was then secured in place using 3-0  chromic in order to prevent migration proximally.  The reservoir was then  inflated with 65 mL of fluid.  The corporotomy incisions were then closed  using 2-0 Vicryl.  The reservoir and  the pump were then connected using the  AMS access and connection kit.  Following testing of the prosthesis did show  adequate inflation of the cylinders with good erections, with no evidence of  cylinder protrusion or herniation or kinking.  The dartos was then closed  using a 3-0 chromic in a running fashion in two layers.  The skin was then  closed using 4-0 Monocryl in a running fashion.  Dermabond was then applied.  A sterile compressive dressing was applied on the penis.  The bladder was  irrigated.  It was clear with no evidence of hematuria.  The Foley catheter  was then put to drainage.  The patient was awoken from anesthesia with no  complications and taken in stable condition to PACU.  Please note that Dr.  Vonita Moss was present and participated in the procedure, as he was the  responsible surgeon.     ______________________________  Cornelious Bryant, MD      Maretta Bees. Vonita Moss, M.D.  Electronically Signed    SK/MEDQ  D:  11/14/2005  T:  11/14/2005  Job:  [id number]

## 2010-08-18 NOTE — Assessment & Plan Note (Signed)
[facility] [place] [facility]                       [place] [facility] OFFICE NOTE   OLIE, DIBERT                  MRN:          [id number]  DATE:03/13/2006                            DOB:          [birth year]    Mr. Strader seen today in follow up.  In 2005, he had a cath with a  borderline LAD lesion.  He had a follow-up Myoview in May 2007 which had  no significant ischemia and an EF of 53%.   His coronary risk factors include hypertension and hyperlipidemia.   However, the patient does not want to be on statin drugs and does not  take aspirin.  His fear of aspirin stems from having one eye.  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  He feels that the aspirin increased the bleeding in  the sclerae.   I'm not sure what natural medications he takes, but it seems to be  working for his blood pressure and cholesterol.  Dr. Lodema Hong apparently  checked his cholesterol recently and was happy with it.  His  hypertension is being treated with Diovan HCT and atenolol.   He is not having any significant chest pain.   He has had bilateral knee replacements and ambulates with some  difficulty but is still very active at home looking after a garden and  his wife.   On exam his blood pressure was 104/62, pulse 68 and regular.  HEENT is  normal.  He is blind in the left eye.  There is no thyromegaly, no  carotid bruits that I can hear.  Apparently there is a question of a  previous left carotid bruit with no significant lesion by duplex.  Lungs  are clear.  There are distant heart tones.  Abdomen is benign.  no AAA  or HSM, no edema.  Status post bilateral knee replacements and right  shoulder surgery.   IMPRESSION:  Stable.  Borderline LAD lesion.  Totally asymptomatic with  reasonable activity.  Probable follow-up Myoview in a year.  Continue  beta blocker and Diovan HCT for blood pressure  control and moderate  coronary disease.  The patient will continue to have his cholesterol  checked at Dr. Anthony Sar office.  He is taking an aspirin a day.   I will see him back in six months.     [name]. Eden Emms, MD, [facility]  Electronically Signed    PCN/MedQ  DD: 03/13/2006  DT: 03/13/2006  Job #: [id number]

## 2010-08-18 NOTE — Consult Note (Signed)
NAME:  Earl Hayes, Earl Hayes NO.:  [id number]   MEDICAL RECORD NO.:  [id number]          PATIENT TYPE:  EMS   LOCATION:  ED                           FACILITY:  [facility]   PHYSICIAN:  Bertram Millard. Dahlstedt, M.D.DATE OF BIRTH:  1928/10/23   DATE OF CONSULTATION:  11/17/2005  DATE OF DISCHARGE:  11/17/2005                                   CONSULTATION   Mr. Arscott is a 77 year old man who underwent implantation of a three-  piece inflatable penile prosthesis three days ago. He had retention and was  not able to void, had a catheter left in. His wife called this morning  because of some penile swelling and discoloration as well as blood near  his meatus. I told him to present to the emergency room.   PHYSICAL EXAMINATION:  Exam revealed appropriate penile swelling. His  foreskin was retracted and somewhat swollen with mild paraphimosis. I did  not see any active bleeding from the urethral meatus. A 16-French Foley  catheter had been promptly positioned coming out through the band of his  scrotal support. I saw no evidence of urethral erosion. Scrotum was  ecchymotic and mildly swollen, but no crepitus was noted.   IMPRESSION:  Penile and scrotal edema post penile implant. No evident  complications noted.   PLAN:  1. Reassurance.  2. She will cleanse his meatus with peroxide and apply Neosporin twice      daily.  3. She will keep followup in two days with Dr. Vonita Moss.      Bertram Millard. Dahlstedt, M.D.  Electronically Signed     SMD/MEDQ  D:  11/17/2005  T:  11/17/2005  Job:  [id number]   cc:   Maretta Bees. Vonita Moss, M.D.  Fax: [phone]

## 2010-08-18 NOTE — Cardiovascular Report (Signed)
NAME:  Earl Hayes, Earl Hayes                     ACCOUNT NO.:  [id number]   MEDICAL RECORD NO.:  [id number]                   PATIENT TYPE:  OIB   LOCATION:  6501                                 FACILITY:  [facility]   PHYSICIAN:  Arturo Morton. Riley Kill, M.D.             DATE OF BIRTH:  [birth year]   DATE OF PROCEDURE:  05/12/2003  DATE OF DISCHARGE:  05/12/2003                              CARDIAC CATHETERIZATION   INDICATIONS:  Mr. Halteman was admitted to the hospital with hyponatremia.  He gradually recovered.  At that time, he also had some chest discomfort.  He subsequently underwent a Cardiolite which yielded questionable ischemia  in the inferobasal segment, and Dr. Myrtis Ser recommended cardiac  catheterization.  Risks, benefits and alternatives were discussed with the  patient in the office visit prior to the procedure and then subsequently in  the catheterization laboratory prior to the procedure as well.   PROCEDURES:  1. Left heart catheterization.  2. Selective coronary arteriography.  3. Selective left ventriculography.  4. Intracoronary nitroglycerin administration.   DESCRIPTION OF PROCEDURE:  The patient was brought to the catheterization  lab and prepped and draped in the usual fashion.  Through an anterior  puncture, the right femoral artery was easily entered.  There was marked  tortuosity and a standard J-wire would not advance.  We were able to use a  Wholey wire to advance into the central aorta and we therefore placed a 6  French 23 cm sheath without difficulty.  He tolerated this without  complication.  A JL-5 was needed to engage the left coronary artery.  There  was marked tortuosity of the thoracic aorta.  Standard Judkins catheter was  used for engagement of the right coronary.  Ventriculography was performed  in the RAO projection.  There were no complications and he was taken to the  holding area in satisfactory clinical condition. I then spoke with his wife  and  reviewed the angiographic studies with the patient's spouse.   HEMODYNAMIC DATA:  1. Central aortic pressure:  136/78, mean 102.  2. Left ventricle:  112/17.  3. No gradient on pullback across the aortic valve.   ANGIOGRAPHIC DATA:  1. Ventriculography was performed in the RAO projection.  Overall systolic     function was well preserved.  There was ectopy so ejection fraction could     not be calculated.  However, normal left ventricular function was     observed.  2. The left main coronary artery was free of critical disease.  3. The left anterior descending artery coursed to the apex.  After the     origin of the septal perforator is a 70% segmental stenosis that is     fairly smooth.  This extends over about a 10-12 mm length.  This is prior     to the take off of the major diagonal branch.  Beyond this, the vessel     opens up  and then there is a segmental area with diffuse narrowing that     is difficult to grade.  After intracoronary nitroglycerin administration,     it did not appear to exceed about 50%.  The diagonal was without critical     narrowing.  4. There is a small ramus intermedius that was without critical disease.  5. The circumflex proper demonstrated minimally irregularity proximally     prior to the bifurcation, but was without critical narrowing.  6. The right coronary artery demonstrates about a 40-50% focal stenosis in     the junction of the proximal to mid vessel.  The distal vessel is without     critical narrowing.   CONCLUSIONS:  1. Preserved overall left ventricular function.  2. Marked tortuosity of the iliacs.  3. 70% segmental stenosis of the proximal/mid left anterior descending     artery.  4. 50% narrowing of the right coronary artery.   DISPOSITION:  The patient has not had significant ischemia in the LAD  territory by radionuclide imaging although the peak heart rate was 110.  He  has also not had significant chest pain except for the  episode where he  presented with a sodium of 118.  At the present time, we will see him back  in the office and review the options with him.  Medical therapy would likely  be recommended based on the lack of demonstrable ischemia and progressive  symptoms.  Lipid lowering with antiplatelet therapy would probably be ideal  at this time.  I will bring the patient back and review the options with him  in detail in the office.                                               Arturo Morton. Riley Kill, M.D.    TDS/MEDQ  D:  05/12/2003  T:  05/12/2003  Job:  [id number]   cc:   Willa Rough, M.D.   Colon Flattery, D.O.  [address]  [place]  [place] [address]  Fax: [phone]

## 2010-08-18 NOTE — Op Note (Signed)
NAME:  Earl Hayes, Earl Hayes                     ACCOUNT NO.:  [id number]   MEDICAL RECORD NO.:  [id number]                   PATIENT TYPE:  AMB   LOCATION:  DAY                                  FACILITY:  [facility]   PHYSICIAN:  Lionel December, M.D.                 DATE OF BIRTH:  May 15, 1928   DATE OF PROCEDURE:  12/09/2003  DATE OF DISCHARGE:                                 OPERATIVE REPORT   PROCEDURE:  Total colonoscopy with polypectomy.   INDICATIONS:  Earl Hayes is a 77 year old Caucasian male who is here for  screening colonoscopy. Family history is negative for colorectal carcinoma.  Procedure and risks were reviewed with the patient and informed consent was  obtained.   PREOPERATIVE MEDICATIONS:  Versed 3 mg IV in divided doses.   FINDINGS:  Procedure performed in endoscopy suite. The patient's vital signs  and O2 saturations were monitored during procedure and remained stable. He  has some drop in his systolic blood pressure without change in his heart  rate. Blood pressure returned to normal without any intervention. Rectal  examination performed. No abnormality noted on external or digital exam.  Olympus video scope was placed in the rectum and advanced into sigmoid colon  and beyond. He still had some segments with stool in his colon. Preparation  was felt to be fair to satisfactory. Scope was advanced to the cecum which  was identified by appendiceal orifice and ileocecal valve. Picture taken for  the record. As the scope was withdrawn, colonic mucosa was examined for the  second time. There was a sessile polyp about 8 to 9 mm at mid transverse  colon. This was raised with submucosal injection of saline and snared in one  piece. There were two other smaller polyps at sigmoid colon which were  snared and submitted in 1 container. The rectal mucosa was normal. Scope was  retroflexed to examine anorectal junction, and it was normal. The scope was  straightened and withdrawn. The  patient tolerated the procedure well.   FINAL DIAGNOSIS:  Three polyps snared. Largest one was at transverse colon,  8 to 9 mm; the two others were at sigmoid colon, smaller in size.   RECOMMENDATIONS:  Standard instructions given. I will be contacting patient  with biopsy results and further recommendations.      ___________________________________________                                            Lionel December, M.D.   NR/MEDQ  D:  12/09/2003  T:  12/09/2003  Job:  [id number]   cc:   Milus Mallick. Lodema Hong, M.D.  [address]  [place], [place] [address]  Fax: [phone]

## 2010-08-21 ENCOUNTER — Telehealth: Payer: Self-pay

## 2010-08-22 ENCOUNTER — Telehealth: Payer: Self-pay | Admitting: Family Medicine

## 2010-08-22 ENCOUNTER — Other Ambulatory Visit: Payer: Self-pay | Admitting: Gastroenterology

## 2010-08-22 ENCOUNTER — Other Ambulatory Visit: Payer: Self-pay | Admitting: Family Medicine

## 2010-08-22 DIAGNOSIS — R932 Abnormal findings on diagnostic imaging of liver and biliary tract: Secondary | ICD-10-CM

## 2010-08-22 DIAGNOSIS — R9389 Abnormal findings on diagnostic imaging of other specified body structures: Secondary | ICD-10-CM

## 2010-08-22 NOTE — Telephone Encounter (Signed)
Dr. Lodema Hong this patient hasn't been to pulmonary only CT scan. After speaking with wife she tells me that he hasn't been to pulmonary doc. And she talking about throat, coughing, gallbladder and a colonoscopy.  When i look in his chart under referral tab only referral for Ct. Please advise

## 2010-08-22 NOTE — Telephone Encounter (Signed)
i spoke with wife, pkls send for OV from the pulmonary doc he saw last week, and put on my desk

## 2010-08-22 NOTE — Telephone Encounter (Signed)
Spoke with pt's spouse he  Needs referral to pulmonary also for abd scan followed by appt with Dr Darrick Penna, she is aware, I am entering all referrals in the referral box

## 2010-08-23 NOTE — Telephone Encounter (Signed)
This has all been addresed

## 2010-08-23 NOTE — Progress Notes (Signed)
Quick Note:  Spoke at length with wife again and referrals have been requested, she agrees with them ______

## 2010-08-24 ENCOUNTER — Encounter: Payer: Self-pay | Admitting: Pulmonary Disease

## 2010-08-30 ENCOUNTER — Other Ambulatory Visit (INDEPENDENT_AMBULATORY_CARE_PROVIDER_SITE_OTHER): Payer: Medicare Other

## 2010-08-30 ENCOUNTER — Ambulatory Visit (INDEPENDENT_AMBULATORY_CARE_PROVIDER_SITE_OTHER): Payer: Medicare Other | Admitting: Gastroenterology

## 2010-08-30 ENCOUNTER — Encounter: Payer: Self-pay | Admitting: Gastroenterology

## 2010-08-30 VITALS — BP 124/70 | HR 52 | Ht 71.0 in | Wt 197.2 lb

## 2010-08-30 DIAGNOSIS — R932 Abnormal findings on diagnostic imaging of liver and biliary tract: Secondary | ICD-10-CM

## 2010-08-30 LAB — COMPREHENSIVE METABOLIC PANEL
BUN: 9 mg/dL (ref 6–23)
CO2: 28 mEq/L (ref 19–32)
Creatinine, Ser: 0.7 mg/dL (ref 0.4–1.5)
GFR: 115.45 mL/min (ref >60.00)
Glucose, Bld: 77 mg/dL (ref 70–99)
Total Bilirubin: 0.9 mg/dL (ref 0.3–1.2)

## 2010-08-30 NOTE — Progress Notes (Signed)
History of Present Illness: This is a 77 year old male here today with a friend. Patient has a power of attorney as does his wife. The patient can provide limited history and the friend has no knowledge of the patient's medical conditions. A recent CT scan of the chest performed for cough revealed intrahepatic biliary ductal dilatation a prominent gallbladder although this area was incompletely evaluated with a chest CT scan. Patient has seen both Dr. Karilyn Cota and Dr. Darrick Penna in the past but did not want to return to either of these gastroenterologist for unclear reasons. Colonoscopy performed in October 2008 revealed a tortuous sigmoid colon but no other abnormalities. The patient denies any chest pain, abdominal pain, weight loss, nausea, vomiting, dysphagia, change in bowel habits, melena or hematochezia. No recent liver function tests. A recent CBC was unremarkable. BMET reveals a low sodium at 131 and a low chloride at 93 and was otherwise unremarkable.  Past Medical History  Diagnosis Date  . Sleep apnea, obstructive     no cpap  . Abnormality of gait   . Hearing loss   . Dementia   . CAD (coronary artery disease)     non obstructive   . Hypertension   . Parkinson disease   . BPH with obstruction/lower urinary tract symptoms   . ED (erectile dysfunction)   . Tortuous colon    Past Surgical History  Procedure Date  . Appendectomy   . Tonsillectomy   . Insertion of male prothesis   . Knee surgery     both  . Shoulder surgery     reports that he has quit smoking. He does not have any smokeless tobacco history on file. He reports that he does not drink alcohol or use illicit drugs. family history includes Colon cancer in his father; Dementia in his sister; and Kidney disease in his sister. No Known Allergies    Outpatient Encounter Prescriptions as of 08/30/2010  Medication Sig Dispense Refill  . amantadine (SYMMETREL) 100 MG capsule Take 100 mg by mouth daily.        Marland Kitchen atenolol  (TENORMIN) 25 MG tablet TAKE ONE TABLET BY MOUTH EVERY DAY  90 tablet  0  . carbidopa-levodopa (SINEMET CR) 25-100 MG per tablet Take 1 tablet by mouth 4 (four) times daily.        . clotrimazole-betamethasone (LOTRISONE) cream Apply topically 2 (two) times daily. Apply twice daily to affected area x 10 days, then as needed       . dutasteride (AVODART) 0.5 MG capsule Take 0.5 mg by mouth daily.        . Elastic Bandages & Supports (TRUFORM STOCKINGS 10-20MMHG) MISC by Does not apply route.        . entacapone (COMTAN) 200 MG tablet Take 200 mg by mouth. Take one and half tablet by mouth four times a day         . fluticasone (CUTIVATE) 0.05 % cream Apply topically 2 (two) times daily.  30 g  2  . lisinopril-hydrochlorothiazide (PRINZIDE,ZESTORETIC) 20-25 MG per tablet Take 1 tablet by mouth.        . Nystatin (PEDI-DRI) 100000 UNIT/GM POWD Apply topically. Apply twice daily to affected area for 10 days, then as needed       . Omeprazole 20 MG TBEC Take 1 tablet (20 mg total) by mouth daily. One tab by mouth once daily - discontinue prilosec    30 each  2  . rivastigmine (EXELON) 4.6 mg/24hr Place 2 patches onto  the skin every morning.        . Tamsulosin HCl (FLOMAX) 0.4 MG CAPS Take by mouth daily.          Review of Systems: Pertinent positive and negative review of systems were noted in the above HPI section. All other review of systems were otherwise negative.  Physical Exam: General: Well developed , well nourished, no acute distress. Elderly man in a wheelchair. Head: Normocephalic and atraumatic Eyes:  sclerae anicteric, EOMI Ears: Decreased auditory acuity Mouth: No deformity or lesions Neck: Supple, no masses or thyromegaly Lungs: Clear throughout to auscultation Heart: Regular rate and rhythm; no murmurs, rubs or bruits Abdomen: Soft, non tender and non distended. No masses, hepatosplenomegaly or hernias noted. Normal Bowel sounds Musculoskeletal: Symmetrical with no gross  deformities  Skin: No lesions on visible extremities Pulses:  Normal pulses noted Extremities: No clubbing, cyanosis, edema or deformities noted Neurological: Alert oriented x 3, memory deficits, significantly limited mobility, slow movements and mask-like face expression Cervical Nodes:  No significant cervical adenopathy Inguinal Nodes: No significant inguinal adenopathy Psychological:  Alert and cooperative. Normal mood and affect  Assessment and Recommendations:  1. Abnormal imaging of the biliary tree with dilated intrahepatic ducts and a prominent gallbladder. Rule out biliary obstruction from a mass lesion, stone, stricture or other process. Obtain liver function tests today and schedule a CT scan of the abdomen and pelvis. Further evaluation as indicated with ERCP or MRCP pending above findings. If ERCP is needed his power of attorney will need to provide informed consent.  2. Dementia.  3. Parkinson's disease.  4. Sleep apnea not on CPAP.  5. GERD. Continue omeprazole and antireflux measures.

## 2010-08-30 NOTE — Patient Instructions (Signed)
You have been scheduled for a CT scan on 09/01/10 at 1:30pm. Separate instructions sheet given to patient. Go directly to the basement to have your labs drawn today.  cc: Syliva Overman, MD

## 2010-08-30 NOTE — Progress Notes (Signed)
pls ok and get appt date for pt with [facility], the family  Has  decided against care here in [place].Dr Darrick Penna sent me this message. pls send any records that that office requests also, thanks.  Pls verify appt date and time with Ms and Mr. Dissinger for this new GI appt, thanks

## 2010-08-30 NOTE — Progress Notes (Signed)
Please fax all available records to [facility]. Pt will establish care with them. We will no longer see him in our office.

## 2010-08-30 NOTE — Progress Notes (Signed)
I called pt to set up OV after CT was done. Pt's wife states that they were going to [facility] and would not need appointment with [place].

## 2010-09-01 ENCOUNTER — Other Ambulatory Visit: Payer: Self-pay

## 2010-09-01 DIAGNOSIS — I10 Essential (primary) hypertension: Secondary | ICD-10-CM

## 2010-09-01 NOTE — Progress Notes (Signed)
Faxed records to [facility]

## 2010-09-01 NOTE — Progress Notes (Signed)
Patient aware and will take him the middle of June

## 2010-09-05 ENCOUNTER — Encounter: Payer: Self-pay | Admitting: Pulmonary Disease

## 2010-09-05 ENCOUNTER — Ambulatory Visit (INDEPENDENT_AMBULATORY_CARE_PROVIDER_SITE_OTHER): Payer: Medicare Other | Admitting: Pulmonary Disease

## 2010-09-05 DIAGNOSIS — R9389 Abnormal findings on diagnostic imaging of other specified body structures: Secondary | ICD-10-CM

## 2010-09-05 DIAGNOSIS — R05 Cough: Secondary | ICD-10-CM

## 2010-09-05 NOTE — Patient Instructions (Addendum)
Make sure you are not taking lisinopril (a blood pressure medication) Pay close attention whether you are coughing after eating/drinking, or if you think solids and liquids may be going down the wrong way Would try taking omeprazole (your reflux medication) in am and pm for 4 weeks to see if cough gets better. Would not do anything different with the findings on your ct scan unless your breathing worsens.

## 2010-09-05 NOTE — Progress Notes (Signed)
  Subjective:    Patient ID: Earl Hayes, male    DOB: 1929-03-07, 77 y.o.   MRN: [id number]  HPI The pt is a 77y/o male who I have been asked to see for an abnormal ct chest.  He recently had a ct which showed very minimal GGO in the lower lobes, but on other abnormal findings.  His cxr around this time did show small lung volumes with basilar atx.  The pt's only pulmonary complaint is a dry hacking cough that worsens with increased voice use.  He denies any breathing issues at this time.  He does have underlying dementia, but denies choking/cough while eating or drinking.  The pt has been on an ACE, but it is unclear from talking with him if he is still taking.  The pt has no obvious exposure history, and although in [employer], denies asbestos exposure.  He has a h/o smoking, but has not done so in greater than 55yrs.  He denies any LE edema.   Review of Systems  Constitutional: Negative for fever and unexpected weight change.  HENT: Negative for ear pain, nosebleeds, congestion, sore throat, rhinorrhea, sneezing, trouble swallowing, dental problem, postnasal drip and sinus pressure.   Eyes: Negative for redness and itching.  Respiratory: Positive for cough. Negative for chest tightness, shortness of breath and wheezing.   Cardiovascular: Negative for palpitations and leg swelling.  Gastrointestinal: Negative for nausea and vomiting.  Genitourinary: Negative for dysuria.  Musculoskeletal: Negative for joint swelling.  Skin: Negative for rash.  Neurological: Negative for headaches.  Hematological: Does not bruise/bleed easily.  Psychiatric/Behavioral: Negative for dysphoric mood. The patient is not nervous/anxious.        Objective:   Physical Exam Constitutional:  Well developed, no acute distress  HENT:  Nares patent without discharge  Oropharynx without exudate, palate and uvula are normal  Eyes:  Perrla, eomi, no scleral icterus  Neck:  No JVD, no TMG  Cardiovascular:   Normal rate, regular rhythm, no rubs or gallops.  2/6 sem        Intact distal pulses  Pulmonary :  Normal breath sounds, no stridor or respiratory distress   Minimal basilar rales, no rhonchi or wheezing  Abdominal:  Soft, nondistended, bowel sounds present.  No tenderness noted.   Musculoskeletal:  mild lower extremity edema noted.  Lymph Nodes:  No cervical lymphadenopathy noted  Skin:  No cyanosis noted  Neurologic:  Alert, mildly confused, moves all 4 extremities without obvious deficit.         Assessment & Plan:

## 2010-09-06 ENCOUNTER — Telehealth: Payer: Self-pay

## 2010-09-06 ENCOUNTER — Ambulatory Visit (INDEPENDENT_AMBULATORY_CARE_PROVIDER_SITE_OTHER)
Admission: RE | Admit: 2010-09-06 | Discharge: 2010-09-06 | Disposition: A | Discharge status: Home / Self Care | Payer: Medicare Other | Source: Ambulatory Visit | Attending: Gastroenterology | Admitting: Gastroenterology

## 2010-09-06 ENCOUNTER — Other Ambulatory Visit: Payer: Self-pay | Admitting: Gastroenterology

## 2010-09-06 DIAGNOSIS — K839 Disease of biliary tract, unspecified: Secondary | ICD-10-CM

## 2010-09-06 DIAGNOSIS — R932 Abnormal findings on diagnostic imaging of liver and biliary tract: Secondary | ICD-10-CM

## 2010-09-06 DIAGNOSIS — K8689 Other specified diseases of pancreas: Secondary | ICD-10-CM

## 2010-09-06 MED ORDER — IOHEXOL 300 MG/ML  SOLN
100.0000 mL | Freq: Once | INTRAMUSCULAR | Status: AC | PRN
  Administered 2010-09-06: 100 mL via INTRAVENOUS

## 2010-09-06 NOTE — Telephone Encounter (Signed)
Message copied by Annett Fabian on Wed Sep 06, 2010  4:35 PM ------      Message from: Claudette Head T      Created: Wed Sep 06, 2010  3:20 PM       See impression      GB sludge likely insignicant finding      Mild biliary dilation and mild PD without a clear etiology likely insignificant finding      Schedule MRCP to further evaluate biliary tree and PD      Copy of CT and LFTs to Dr. Lodema Hong

## 2010-09-06 NOTE — Telephone Encounter (Signed)
MRCP scheduled at [facility] for 09/12/10 3:30 .  I have notified the pt's wife of the results and the appt for the MRCP.

## 2010-09-12 ENCOUNTER — Ambulatory Visit
Admission: RE | Admit: 2010-09-12 | Discharge: 2010-09-12 | Disposition: A | Discharge status: Home / Self Care | Payer: Medicare Other | Source: Ambulatory Visit | Attending: Gastroenterology | Admitting: Gastroenterology

## 2010-09-12 DIAGNOSIS — K8689 Other specified diseases of pancreas: Secondary | ICD-10-CM

## 2010-09-12 DIAGNOSIS — K839 Disease of biliary tract, unspecified: Secondary | ICD-10-CM

## 2010-09-12 MED ORDER — GADOBENATE DIMEGLUMINE 529 MG/ML IV SOLN
18.0000 mL | Freq: Once | INTRAVENOUS | Status: AC | PRN
  Administered 2010-09-12: 18 mL via INTRAVENOUS

## 2010-09-14 ENCOUNTER — Encounter: Payer: Self-pay | Admitting: Pulmonary Disease

## 2010-09-14 NOTE — Assessment & Plan Note (Addendum)
His cough really sounds more upper than lower airway in origin.  The findings on the ct chest are not significant enough to cause this issue.  I have asked him to try increasing his ppi dose for the next 4 weeks to see if helps, and to make sure he is still not taking an ACE.  I have also asked him and his family member to really monitor his coughing while eating/drinking.

## 2010-09-14 NOTE — Assessment & Plan Note (Signed)
The pt's ct chest shows minimal GGO in lower lobes that I suspect is due to atelectasis.  However, cannot exclude the possibility of silent aspiration with his dementia history, and this needs to be kept in mind going forward.  Unlikely to be due to ISLD, but even if it is, would not do anything different given his age and underlying dementia.  Would not pursue further.

## 2010-10-09 ENCOUNTER — Other Ambulatory Visit: Payer: Self-pay | Admitting: Family Medicine

## 2010-10-11 ENCOUNTER — Other Ambulatory Visit: Payer: Self-pay | Admitting: Family Medicine

## 2010-10-24 ENCOUNTER — Encounter: Payer: Self-pay | Admitting: Family Medicine

## 2010-10-26 ENCOUNTER — Encounter: Payer: Self-pay | Admitting: Family Medicine

## 2010-10-26 ENCOUNTER — Ambulatory Visit (INDEPENDENT_AMBULATORY_CARE_PROVIDER_SITE_OTHER): Payer: Medicare Other | Admitting: Family Medicine

## 2010-10-26 VITALS — BP 114/78 | HR 55 | Resp 16 | Ht 67.0 in | Wt 204.4 lb

## 2010-10-26 DIAGNOSIS — K8020 Calculus of gallbladder without cholecystitis without obstruction: Secondary | ICD-10-CM

## 2010-10-26 DIAGNOSIS — G20A1 Parkinson's disease without dyskinesia, without mention of fluctuations: Secondary | ICD-10-CM

## 2010-10-26 DIAGNOSIS — R9389 Abnormal findings on diagnostic imaging of other specified body structures: Secondary | ICD-10-CM

## 2010-10-26 DIAGNOSIS — G20 Parkinson's disease: Secondary | ICD-10-CM

## 2010-10-26 DIAGNOSIS — L723 Sebaceous cyst: Secondary | ICD-10-CM

## 2010-10-26 DIAGNOSIS — I10 Essential (primary) hypertension: Secondary | ICD-10-CM

## 2010-10-26 NOTE — Patient Instructions (Signed)
F/u in mid to end October.  I happy that you are doing so well, pls keep it up, no changes in medication  Chem 7  Non fasting  In October

## 2010-10-27 NOTE — Progress Notes (Signed)
  Subjective:    Patient ID: Earl Hayes, male    DOB: December 05, 1928, 77 y.o.   MRN: [id number]  HPI The PT is here for follow up and re-evaluation of chronic medical conditions, medication management and review of recent lab and radiology data.  Questions or concerns regarding consultations or procedures which the PT has had in the interim are  Addressed.He he has been evaluated by pulmonary to his satisfaction, The PT denies any adverse reactions to current medications since the last visit.  There are no new concerns.  C/o cyst on back, a recurrent problem, wants this checked    Review of Systems Denies recent fever or chills. Denies sinus pressure, nasal congestion, ear pain or sore throat. Denies chest congestion, productive cough or wheezing.Still has dry cough, but not as severe Denies chest pains, palpitations and leg swelling Denies abdominal pain, nausea, vomiting,diarrhea or constipation.   Denies dysuria, frequency, hesitancy or incontinence. Chronic wheelchair dependence due to Parkinson's, unsteady gait and weakness. Denies headaches, seizures, numbness, or tingling. Denies depression, anxiety or insomnia. Denies skin break down or rash.        Objective:   Physical Exam Patient alert and oriented and in no cardiopulmonary distress.  HEENT: No facial asymmetry, EOMI, no sinus tenderness,  oropharynx pink and moist.  Neck decreased ROM no adenopathy.  Chest: Clear to auscultation bilaterally.  CVS: S1, S2 no murmurs, no S3.  ABD: Soft non tender. Bowel sounds normal.  Ext: No edema  MS: Decreased  ROM spine, shoulders, hips and knees Skin: Intact, Sebaceous cyst on upper back, no sign of infection, unable to extrude any material with pressure  Psych: Good eye contact, flat affect. Memory loss not anxious or depressed appearing.  CNS: though pt has significant hearing loss, he wears aids.He also wears corrective lenses for vision        Assessment  & Plan:   No problem-specific assessment & plan notes found for this encounter.

## 2010-11-05 DIAGNOSIS — K8020 Calculus of gallbladder without cholecystitis without obstruction: Secondary | ICD-10-CM | POA: Insufficient documentation

## 2010-11-05 DIAGNOSIS — L723 Sebaceous cyst: Secondary | ICD-10-CM | POA: Insufficient documentation

## 2010-11-05 NOTE — Assessment & Plan Note (Signed)
Controlled, no change in medication  

## 2010-11-05 NOTE — Assessment & Plan Note (Signed)
Followed by neurology, has had no med change

## 2010-11-05 NOTE — Assessment & Plan Note (Signed)
A recurrent problem, no sign of infection at this time

## 2010-11-05 NOTE — Assessment & Plan Note (Signed)
Asymptomatic, incidiental radiologic finding, no surgery planned, pt and spouse made aware of warning signs of impending problems which would warrant ED eval, they both voice understanding

## 2010-11-05 NOTE — Assessment & Plan Note (Signed)
Reports less cough. He has had pulmonary evaluation, and both himself and his wife are satisfied, no futher w/u planned

## 2010-11-07 ENCOUNTER — Other Ambulatory Visit: Payer: Self-pay | Admitting: Family Medicine

## 2010-11-09 ENCOUNTER — Other Ambulatory Visit: Payer: Self-pay | Admitting: Family Medicine

## 2010-11-10 ENCOUNTER — Telehealth: Payer: Self-pay | Admitting: Family Medicine

## 2010-11-10 MED ORDER — OMEPRAZOLE 20 MG PO CPDR: 20.0000 mg | DELAYED_RELEASE_CAPSULE | Freq: Every day | ORAL | Status: DC

## 2010-11-10 NOTE — Telephone Encounter (Signed)
Med sent as requested 

## 2010-12-05 ENCOUNTER — Telehealth: Payer: Self-pay | Admitting: Family Medicine

## 2010-12-05 NOTE — Telephone Encounter (Signed)
Patient has a place on his arm from a fall on thursday, states it is a gash and she is concerned it may become infected, has been putting peroxide on it.

## 2010-12-05 NOTE — Telephone Encounter (Signed)
pls advise and erx keflex 500mg  one twice daily #14 only. Let her know we have the flu vaccineas of next week also pls

## 2010-12-05 NOTE — Telephone Encounter (Signed)
pls call and clarify the message and document

## 2010-12-06 ENCOUNTER — Ambulatory Visit: Payer: Medicare Other | Admitting: Family Medicine

## 2010-12-06 MED ORDER — CEPHALEXIN 500 MG PO CAPS: 500.0000 mg | ORAL_CAPSULE | Freq: Two times a day (BID) | ORAL | Status: AC

## 2010-12-06 NOTE — Telephone Encounter (Signed)
Patients wife is aware that med is at the pharmacy

## 2010-12-06 NOTE — Telephone Encounter (Signed)
Addended by: Adella Hare B on: 12/06/2010 08:32 AM   Modules accepted: Orders

## 2010-12-14 ENCOUNTER — Other Ambulatory Visit: Payer: Self-pay | Admitting: Family Medicine

## 2010-12-15 ENCOUNTER — Telehealth: Payer: Self-pay | Admitting: Family Medicine

## 2010-12-15 MED ORDER — BACITRACIN ZINC 500 UNIT/GM EX OINT: TOPICAL_OINTMENT | CUTANEOUS | Status: DC

## 2010-12-15 NOTE — Telephone Encounter (Signed)
Advised patient med was sent in earlier this week, he states wife has went to pick up med now

## 2010-12-15 NOTE — Telephone Encounter (Signed)
Forwarded message to Dr Jeanice Lim since dr simpson was gone

## 2010-12-15 NOTE — Telephone Encounter (Signed)
He does not require any more oral antibiotics, he had a seven day course. She should keep the wound clean and dry, put the new antibiotic ointment on twice a day after cleaning and cover with gauze which she can get at [facility]. If this is not healing she needs to bring him for an office visit next week, if it gets worse this weekend she needs to go to the ER

## 2010-12-15 NOTE — Telephone Encounter (Signed)
She is requesting a refill of the antiobiotic. She said he fell again but he didn't hurt it. It is still looking bad, red and inflamed. She is so scared its gonna get infected and she is going to have to take him to the ER and she can't afford that. She is at the pharmacy waiting. Said she would have to leave soon but she will go back as soon as it is refilled. Does he need OV?

## 2010-12-15 NOTE — Telephone Encounter (Signed)
Addended by: Milinda Antis F on: 12/15/2010 02:19 PM   Modules accepted: Orders

## 2010-12-15 NOTE — Telephone Encounter (Signed)
Sent message to Dr Lodema Hong this am but she didn't respond before she left. His wife is begging for a refill of keflex.

## 2010-12-15 NOTE — Telephone Encounter (Signed)
See below message

## 2010-12-15 NOTE — Telephone Encounter (Signed)
Patient aware.

## 2010-12-18 ENCOUNTER — Encounter: Payer: Self-pay | Admitting: Family Medicine

## 2010-12-18 ENCOUNTER — Ambulatory Visit (INDEPENDENT_AMBULATORY_CARE_PROVIDER_SITE_OTHER): Payer: Medicare Other | Admitting: Family Medicine

## 2010-12-18 VITALS — BP 110/80 | HR 57 | Resp 16 | Ht 71.0 in | Wt 202.8 lb

## 2010-12-18 DIAGNOSIS — G20A1 Parkinson's disease without dyskinesia, without mention of fluctuations: Secondary | ICD-10-CM

## 2010-12-18 DIAGNOSIS — Z23 Encounter for immunization: Secondary | ICD-10-CM

## 2010-12-18 DIAGNOSIS — W06XXXA Fall from bed, initial encounter: Secondary | ICD-10-CM

## 2010-12-18 DIAGNOSIS — R269 Unspecified abnormalities of gait and mobility: Secondary | ICD-10-CM

## 2010-12-18 DIAGNOSIS — G20 Parkinson's disease: Secondary | ICD-10-CM

## 2010-12-18 DIAGNOSIS — I10 Essential (primary) hypertension: Secondary | ICD-10-CM

## 2010-12-18 MED ORDER — INFLUENZA VAC TYPES A & B PF IM SUSP: 0.5000 mL | Freq: Once | INTRAMUSCULAR | Status: AC

## 2010-12-18 NOTE — Patient Instructions (Signed)
F/u as before  Use dry gauze dressing to laceration on left shoulder.   tdaP and flu vaccine today

## 2010-12-18 NOTE — Progress Notes (Signed)
  Subjective:    Patient ID: Earl Hayes, male    DOB: 07-03-1928, 77 y.o.   MRN: [id number]  HPI Larey Seat while using the urinal sitting on the side of  The bed  2 weeks ago, has bruising to the right forehead, right forearm  And also to the right shoulder, with bleeding still. Also  Noted that he has bruising  On left forearm He did use topical antibiotic with prevention of superinfection.   Review of Systems See HPI Denies recent fever or chills. Denies sinus pressure, nasal congestion, ear pain or sore throat. Denies chest congestion, productive cough or wheezing. Chronic gait disturbance  and limitation in mobility. Denies headaches, seizures, numbness, or tingling. Denies depression, anxiety or insomnia.        Objective:   Physical Exam  Patient alert and oriented and in no cardiopulmonary distress.  HEENT: No facial asymmetry, EOMI, no sinus tenderness,  oropharynx pink and moist.  Neck decreased ROM, no adenopathy.  Chest: Clear to auscultation bilaterally.  CVS: S1, S2 no murmurs, no S3.  ABD: Soft non tender. Bowel sounds normal.  Ext: No edema  MS: decreased ROM spine, shoulders, hips and knees.  Skin: Lacerations and bruising to left upper arm  Psych: Good eye contact,flat  affect. Memory impaired  not anxious or depressed appearing.  CNS: CN 2-12 intact, power, tone and sensation normal throughout.       Assessment & Plan:

## 2010-12-31 NOTE — Assessment & Plan Note (Signed)
Followed by neurology, pt to continue current med. Discussed the fact that he has unsteady gait and should exercise a lot of caution with mobility, espescialy in light of recent fall

## 2010-12-31 NOTE — Assessment & Plan Note (Signed)
Unchanged, with recent fall, precautions and use of a urinal in bed discussed

## 2010-12-31 NOTE — Assessment & Plan Note (Signed)
Controlled, no change in medication  

## 2011-01-11 ENCOUNTER — Encounter: Payer: Self-pay | Admitting: Family Medicine

## 2011-01-15 ENCOUNTER — Telehealth: Payer: Self-pay | Admitting: Family Medicine

## 2011-01-15 NOTE — Telephone Encounter (Signed)
pls order chem 7 dx htn, he does not have to fast, let her know

## 2011-01-17 ENCOUNTER — Ambulatory Visit: Payer: Medicare Other | Admitting: Family Medicine

## 2011-01-17 LAB — BASIC METABOLIC PANEL
CO2: 29 mEq/L (ref 19–32)
Calcium: 9.6 mg/dL (ref 8.4–10.5)
Chloride: 96 mEq/L (ref 96–112)
Sodium: 134 mEq/L — ABNORMAL LOW (ref 135–145)

## 2011-01-17 NOTE — Telephone Encounter (Signed)
Labs ordered and resulted.

## 2011-01-22 ENCOUNTER — Telehealth: Payer: Self-pay | Admitting: Family Medicine

## 2011-01-22 NOTE — Telephone Encounter (Signed)
Patient is aware 

## 2011-01-22 NOTE — Telephone Encounter (Signed)
Will need a nurse visit only for bp eval pls sched, this week

## 2011-01-25 ENCOUNTER — Ambulatory Visit: Payer: Medicare Other

## 2011-01-25 VITALS — BP 120/70 | Wt 201.4 lb

## 2011-01-25 DIAGNOSIS — I10 Essential (primary) hypertension: Secondary | ICD-10-CM

## 2011-01-25 NOTE — Progress Notes (Signed)
No med changes

## 2011-02-02 ENCOUNTER — Encounter: Payer: Self-pay | Admitting: Family Medicine

## 2011-02-06 ENCOUNTER — Encounter: Payer: Self-pay | Admitting: Family Medicine

## 2011-02-06 ENCOUNTER — Ambulatory Visit (INDEPENDENT_AMBULATORY_CARE_PROVIDER_SITE_OTHER): Payer: Medicare Other | Admitting: Family Medicine

## 2011-02-06 VITALS — BP 120/82 | HR 62 | Resp 16 | Ht 71.0 in | Wt 200.1 lb

## 2011-02-06 DIAGNOSIS — I10 Essential (primary) hypertension: Secondary | ICD-10-CM

## 2011-02-06 DIAGNOSIS — R5381 Other malaise: Secondary | ICD-10-CM

## 2011-02-06 DIAGNOSIS — R5383 Other fatigue: Secondary | ICD-10-CM

## 2011-02-06 DIAGNOSIS — H9190 Unspecified hearing loss, unspecified ear: Secondary | ICD-10-CM

## 2011-02-06 DIAGNOSIS — Z79899 Other long term (current) drug therapy: Secondary | ICD-10-CM

## 2011-02-06 DIAGNOSIS — G20A1 Parkinson's disease without dyskinesia, without mention of fluctuations: Secondary | ICD-10-CM

## 2011-02-06 DIAGNOSIS — G20 Parkinson's disease: Secondary | ICD-10-CM

## 2011-02-06 NOTE — Patient Instructions (Addendum)
F/u in mid March.  Fasting lipid, chem 7, tsh and cbc in March.  You look very well, and your blood pressure is excellent.  I hope you keep well.  No new medications at this time

## 2011-02-06 NOTE — Progress Notes (Signed)
  Subjective:    Patient ID: Earl Hayes, male    DOB: May 02, 1928, 77 y.o.   MRN: [id number]  HPI Pt in for f/u  Doing well wih no complaints. Recent urology eval suggests prosatate disease, however based on age and overall condition, watchful waiting is the approach Wife is concernedthat his parkinson's is worsening    Review of Systems See HPI   Denies recent fever or chills. Denies sinus pressure, nasal congestion, ear pain or sore throat. Denies chest congestion, productive cough or wheezing. Denies chest pains, palpitations and leg swelling Denies abdominal pain, nausea, vomiting,diarrhea or constipation.   Denies dysuria, frequency, hesitancy or incontinence. Chronic joint pain and stiffness, wheelchair confined Denies headaches, seizures, numbness, or tingling. Denies depression, anxiety or insomnia. Denies skin break down or rash.     Objective:   Physical Exam Patient alert and in no cardiopulmonary distress.  HEENT: No facial asymmetry, EOMI, no sinus tenderness,  oropharynx pink and moist.  Neck supple no adenopathy.  Chest: Clear to auscultation bilaterally.  CVS: S1, S2 no murmurs, no S3.  ABD: Soft non tender. Bowel sounds normal.  Ext: No edema  [id number]  ROM spine, shoulders, hips and knees.  Skin: Intact, no ulcerations or rash noted.  Psych: Good eye contact,flat  affect. Memory loss, not anxious or depressed appearing.  CNS: CN 2-12 intact, power, tone and sensation normal throughout.        Assessment & Plan:

## 2011-02-12 NOTE — Assessment & Plan Note (Signed)
Controlled, no change in medication  

## 2011-02-12 NOTE — Assessment & Plan Note (Signed)
Unchanged, wears hearing aids

## 2011-02-12 NOTE — Assessment & Plan Note (Signed)
Wife concerned that this is worsening , advised that she discuss with neurology at next visit

## 2011-02-15 ENCOUNTER — Telehealth: Payer: Self-pay | Admitting: Family Medicine

## 2011-02-16 MED ORDER — DUTASTERIDE 0.5 MG PO CAPS: 0.5000 mg | ORAL_CAPSULE | Freq: Every day | ORAL | Status: DC

## 2011-02-16 NOTE — Telephone Encounter (Signed)
Sent referral to [employer]

## 2011-03-12 ENCOUNTER — Telehealth: Payer: Self-pay | Admitting: Family Medicine

## 2011-03-12 NOTE — Telephone Encounter (Signed)
Refill x  2 please and let her know

## 2011-03-12 NOTE — Telephone Encounter (Signed)
Do you want to refill Bacitracin

## 2011-03-13 MED ORDER — BACITRACIN ZINC 500 UNIT/GM EX OINT: TOPICAL_OINTMENT | CUTANEOUS | Status: AC

## 2011-03-13 NOTE — Telephone Encounter (Signed)
Spoke with wife and informed her that the ointment has been sent to the pharmacy of choice.

## 2011-03-14 ENCOUNTER — Telehealth: Payer: Self-pay | Admitting: Family Medicine

## 2011-03-14 MED ORDER — FLUTICASONE PROPIONATE 0.05 % EX CREA: TOPICAL_CREAM | Freq: Two times a day (BID) | CUTANEOUS | Status: AC

## 2011-03-14 NOTE — Telephone Encounter (Signed)
Ok to send in cultivate cream? 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

## 2011-03-14 NOTE — Telephone Encounter (Signed)
Refill x 1 pls 

## 2011-03-14 NOTE — Telephone Encounter (Signed)
Sent in

## 2011-05-02 ENCOUNTER — Encounter: Payer: Self-pay | Admitting: Family Medicine

## 2011-05-03 ENCOUNTER — Ambulatory Visit: Payer: Medicare Other | Admitting: Family Medicine

## 2011-05-03 ENCOUNTER — Encounter: Payer: Self-pay | Admitting: Family Medicine

## 2011-05-22 ENCOUNTER — Other Ambulatory Visit: Payer: Self-pay | Admitting: Family Medicine

## 2011-05-31 ENCOUNTER — Encounter: Payer: Self-pay | Admitting: Family Medicine

## 2011-05-31 ENCOUNTER — Ambulatory Visit (INDEPENDENT_AMBULATORY_CARE_PROVIDER_SITE_OTHER): Payer: Medicare Other | Admitting: Family Medicine

## 2011-05-31 DIAGNOSIS — G20A1 Parkinson's disease without dyskinesia, without mention of fluctuations: Secondary | ICD-10-CM

## 2011-05-31 DIAGNOSIS — I10 Essential (primary) hypertension: Secondary | ICD-10-CM

## 2011-05-31 DIAGNOSIS — G20 Parkinson's disease: Secondary | ICD-10-CM

## 2011-05-31 DIAGNOSIS — F068 Other specified mental disorders due to known physiological condition: Secondary | ICD-10-CM

## 2011-05-31 DIAGNOSIS — K8020 Calculus of gallbladder without cholecystitis without obstruction: Secondary | ICD-10-CM

## 2011-05-31 LAB — CBC WITH DIFFERENTIAL/PLATELET
Basophils Relative: 0 % (ref 0–1)
Eosinophils Absolute: 0.1 10*3/uL (ref 0.0–0.7)
Hemoglobin: 16 g/dL (ref 13.0–17.0)
MCH: 30.9 pg (ref 26.0–34.0)
MCHC: 32.9 g/dL (ref 30.0–36.0)
Monocytes Relative: 13 % — ABNORMAL HIGH (ref 3–12)
Neutrophils Relative %: 71 % (ref 43–77)

## 2011-05-31 LAB — TSH: TSH: 0.948 u[IU]/mL (ref 0.350–4.500)

## 2011-05-31 NOTE — Patient Instructions (Addendum)
F/U in 4.5  month    You look extremely well.  We will call with lab work.  No med changes at this time.  We will try to get exelon patches for you

## 2011-06-01 LAB — LIPID PANEL
Cholesterol: 182 mg/dL (ref 0–200)
LDL Cholesterol: 114 mg/dL — ABNORMAL HIGH (ref 0–99)
VLDL: 13 mg/dL (ref 0–40)

## 2011-06-01 LAB — BASIC METABOLIC PANEL
BUN: 8 mg/dL (ref 6–23)
CO2: 21 mEq/L (ref 19–32)
Chloride: 100 mEq/L (ref 96–112)
Creat: 0.86 mg/dL (ref 0.50–1.35)

## 2011-06-02 NOTE — Assessment & Plan Note (Signed)
Continue exelon, will attempt to get samples

## 2011-06-02 NOTE — Assessment & Plan Note (Addendum)
Elevated SBP, no change in medication

## 2011-06-02 NOTE — Progress Notes (Signed)
  Subjective:    Patient ID: Earl Hayes, male    DOB: March 18, 1929, 78 y.o.   MRN: [id number]  HPI The PT is here for follow up and re-evaluation of chronic medical conditions, medication management and review of any available recent lab and radiology data.  Preventive health is updated, specifically   Immunization.   Questions or concerns regarding consultations or procedures which the PT has had in the interim are  addressed. The PT denies any adverse reactions to current medications since the last visit.  There are no new concerns.  There are no specific complaints  Pt accompanied by his wife, whose main concern is procuring a [employer]enaida Niece to be able to get him around more, in other words, seems to be doing well from her perspective at this time. Pt himself is an inconsistent historian due to dementia      Review of Systems See HPI Denies recent fever or chills. Denies sinus pressure, nasal congestion, ear pain or sore throat. Denies chest congestion, productive cough or wheezing. Denies chest pains, palpitations and leg swelling Denies abdominal pain, nausea, vomiting,diarrhea or constipation.   Denies dysuria, frequency, hesitancy or incontinence.  Denies headaches, seizures, numbness, or tingling. Denies depression, anxiety or insomnia. Denies skin break down or rash.        Objective:   Physical Exam Patient alert  and in no cardiopulmonary distress.Looks well and pleasant  HEENT: No facial asymmetry, EOMI, no sinus tenderness,  oropharynx pink and moist.  Neck decreased ROM no adenopathy.  Chest: Clear to auscultation bilaterally.  CVS: S1, S2 no murmurs, no S3.  ABD: Soft non tender. Bowel sounds normal.  Ext: No edema  MS: decreased  ROM spine, shoulders, hips and knees.  Skin: Intact, no ulcerations or rash noted.  Psych: Good eye contact, normal affect. Memory loss not anxious or depressed appearing.  CNS: CN 2-12 intact,hearing loss, power, normal  throughout.        Assessment & Plan:

## 2011-06-02 NOTE — Assessment & Plan Note (Signed)
Asymptomatic, no surgical intervention 

## 2011-06-02 NOTE — Assessment & Plan Note (Signed)
Followed by neurology, appears stable

## 2011-06-04 ENCOUNTER — Other Ambulatory Visit: Payer: Self-pay

## 2011-06-04 MED ORDER — RIVASTIGMINE 4.6 MG/24HR TD PT24: 2.0000 | MEDICATED_PATCH | TRANSDERMAL | Status: DC

## 2011-06-07 ENCOUNTER — Ambulatory Visit: Payer: Medicare Other | Admitting: Family Medicine

## 2011-07-23 ENCOUNTER — Telehealth: Payer: Self-pay | Admitting: Family Medicine

## 2011-07-26 NOTE — Telephone Encounter (Signed)
Advised no labs needed at this time

## 2011-07-31 ENCOUNTER — Telehealth: Payer: Self-pay | Admitting: Family Medicine

## 2011-08-01 NOTE — Telephone Encounter (Signed)
Foot had gave out on him last week but now it is doing ok. Not having anymore problems with it

## 2011-08-14 ENCOUNTER — Ambulatory Visit: Payer: Medicare Other | Admitting: Family Medicine

## 2011-08-15 ENCOUNTER — Telehealth: Payer: Self-pay | Admitting: Family Medicine

## 2011-08-21 NOTE — Telephone Encounter (Signed)
Spoke with Nephew

## 2011-09-13 ENCOUNTER — Other Ambulatory Visit: Payer: Self-pay | Admitting: Cardiology

## 2011-09-13 DIAGNOSIS — I6529 Occlusion and stenosis of unspecified carotid artery: Secondary | ICD-10-CM

## 2011-09-17 ENCOUNTER — Encounter (INDEPENDENT_AMBULATORY_CARE_PROVIDER_SITE_OTHER): Payer: Medicare Other

## 2011-09-17 DIAGNOSIS — R0989 Other specified symptoms and signs involving the circulatory and respiratory systems: Secondary | ICD-10-CM

## 2011-09-17 DIAGNOSIS — I6529 Occlusion and stenosis of unspecified carotid artery: Secondary | ICD-10-CM

## 2011-10-02 ENCOUNTER — Encounter: Payer: Self-pay | Admitting: *Deleted

## 2011-11-05 ENCOUNTER — Ambulatory Visit: Payer: Medicare Other | Admitting: Family Medicine

## 2011-12-06 ENCOUNTER — Other Ambulatory Visit: Payer: Self-pay | Admitting: Internal Medicine

## 2011-12-06 DIAGNOSIS — M549 Dorsalgia, unspecified: Secondary | ICD-10-CM

## 2011-12-11 ENCOUNTER — Other Ambulatory Visit: Payer: Self-pay | Admitting: Internal Medicine

## 2011-12-11 ENCOUNTER — Ambulatory Visit
Admission: RE | Admit: 2011-12-11 | Discharge: 2011-12-11 | Disposition: A | Discharge status: Home / Self Care | Payer: Medicare Other | Source: Ambulatory Visit | Attending: Internal Medicine | Admitting: Internal Medicine

## 2011-12-11 VITALS — BP 119/76 | HR 67

## 2011-12-11 DIAGNOSIS — M549 Dorsalgia, unspecified: Secondary | ICD-10-CM

## 2011-12-11 MED ORDER — IOHEXOL 180 MG/ML  SOLN
1.0000 mL | Freq: Once | INTRAMUSCULAR | Status: AC | PRN
  Administered 2011-12-11: 1 mL via EPIDURAL

## 2011-12-11 MED ORDER — METHYLPREDNISOLONE ACETATE 40 MG/ML INJ SUSP (RADIOLOG
120.0000 mg | Freq: Once | INTRAMUSCULAR | Status: AC
  Administered 2011-12-11: 120 mg via EPIDURAL

## 2012-07-02 ENCOUNTER — Encounter: Payer: Self-pay | Admitting: Neurology

## 2012-07-02 ENCOUNTER — Ambulatory Visit (INDEPENDENT_AMBULATORY_CARE_PROVIDER_SITE_OTHER): Payer: PRIVATE HEALTH INSURANCE | Admitting: Neurology

## 2012-07-02 VITALS — BP 100/66 | HR 67 | Temp 98.6°F | Ht 67.0 in

## 2012-07-02 DIAGNOSIS — G20 Parkinson's disease: Secondary | ICD-10-CM

## 2012-07-02 DIAGNOSIS — G20C Parkinsonism, unspecified: Secondary | ICD-10-CM | POA: Insufficient documentation

## 2012-07-02 DIAGNOSIS — R269 Unspecified abnormalities of gait and mobility: Secondary | ICD-10-CM | POA: Insufficient documentation

## 2012-07-02 DIAGNOSIS — F068 Other specified mental disorders due to known physiological condition: Secondary | ICD-10-CM

## 2012-07-02 HISTORY — DX: Parkinson's disease: G20

## 2012-07-02 HISTORY — DX: Parkinsonism, unspecified: G20.C

## 2012-07-02 HISTORY — DX: Unspecified abnormalities of gait and mobility: R26.9

## 2012-07-02 NOTE — Progress Notes (Signed)
Subjective:    Patient ID: Earl Hayes is a 79 y.o. male.  HPI Interim history:  Earl Hayes is a pleasant 79 year old right-handed gentleman who presents for followup consultation of his dementia, gait disorder, and suspected Parkinson's disease. This is his first visit with me and he is accompanied by a friend, [facility], today. He is a former patient of Earl [facility] and has been followed by him for a few years, since 2008. He has an underlying history of coronary artery disease, hypertension, hyperlipidemia, chronic lumbar spinal stenosis, and memory loss with parkinsonism and gait dysfunction. Earl Hayes had tried him on Sinemet and Exelon with poor response. Also Requip was tried without benefit. He was last seen by Earl Hayes on 03/10/2012 at which time he he was advised to continue with Sinemet and Exelon patch, use his walker at all times. [facility] resides [facility]. I reviewed their records. His current medications are: Amantadine 100 mg once daily, baby aspirin once daily, atenolol 25 mg once daily, Exelon patch 9.5 mg once daily, finasteride 5 mg once daily, Flomax 0.4 mg once daily, lisinopril-hydrochlorothiazide 20-25 mg once daily, pravastatin 20 mg at night, senna once daily, entacapone 200 mg half a tablet 4 times a day, Sinemet 25/100 mg strength 2 tablets 4 times a day, at 5 AM, 9 AM, 1 PM and 5 PM, Xanax 0.5 mg as needed for sleep.  I reviewed [facility] prior notes and the patient's records and below is a summary of that review:  [name] with a history of dementia, gait disorder, resting tremor, and suspected Parkinson's disease. There is a family history of [facility] disease with his sister. He was initially treated with a trial of Sinemet and Exelon with poor response, then ropinirole without benefit, and more recently with Sinemet plus Comtan with some response. He has a history of low back pain and left posterior thigh pain. MRI L spine on 11/27/2006  showed severe central stenosis at L4-5 secondary to ligamentum flavum hypertrophy, facet hypertrophy., degenerative disease and bulging. He also had moderately severe stenosis at L3-4 and to a lesser extent L2-3. There is evidence of spondylosis at T11-L1 and neuroforaminal  stenosis at L2-3 through L5-S1. There is also evidence of facet arthropathy at L2-3 through L5-S1. He received a single shot of epidural steroids and has done well for 3 years with his pain. He has had balance issues. He has no bowel or bladder dysfunction. He denies numbness. Repeat L spine MRI on 11/01/08 was without change. He received epidural steroids on 08/02/09 with excellent results. Plain lumbar spine film on 06/02/09 with diffuse DDD and osteopenia at  L2-L3 L5-S1. He is very hard of hearing. His power of attorney is his pastor at church. He an his wife reside at [facility]. MMSE 22/30, CDT 1/4, AFT 7. Geriatric depression scale 2/15. Falls assessment tool score 19. MMSE on 11/08/11: 28/30, AFT10, CDT 2/4.    E[facility] reports that the patient fell last night. Apparently he was reaching for some medicine that had fallen down and he and his wife both felt. He injured his left forearm and has a bandage over it. He did not hit his head or had any loss of consciousness. The patient is situated in his wheelchair and is not able to provide much in the way of history. He is indeed very hard of hearing despite both hearing aids in place.  His Past Medical History Is Significant For: Past Medical History  Diagnosis Date  . Sleep apnea, obstructive     no cpap  . Abnormality of gait   . Hearing loss   . Dementia   . CAD (coronary artery disease)     non obstructive   . Hypertension   . Parkinson disease   . BPH with obstruction/lower urinary tract symptoms   . ED (erectile dysfunction)   . Tortuous colon   . Hyperlipidemia   . Gait disorder 07/02/2012  . Parkinsonism 07/02/2012    His Past Surgical History Is Significant For: Past  Surgical History  Procedure Laterality Date  . Appendectomy    . Tonsillectomy    . Insertion of male prothesis    . Knee surgery      both  . Shoulder surgery      Right    His Family History Is Significant For: Family History  Problem Relation Age of Onset  . Colon cancer Father   . Kidney disease Sister   . Dementia Sister     His Social History Is Significant For: History   Social History  . Marital Status: Married    Spouse Name: [facility]    Number of Children: 3  . Years of Education: N/A   Occupational History  . 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 Smoking status: Former Smoker -- 1.00 packs/day for 47 years    Types: Cigarettes    Quit date: 04/02/1985  . Smokeless tobacco: None  . Alcohol Use: No  . Drug Use: No  . Sexually Active: None   Other Topics Concern  . None   Social History Narrative  . None    His Allergies Are:  No Known Allergies:   His Current Medications Are:  Outpatient Encounter Prescriptions as of 07/02/2012  Medication Sig Dispense Refill  . ALPRAZolam (XANAX) 0.5 MG tablet       . amantadine (SYMMETREL) 100 MG capsule Take 100 mg by mouth daily.        Marland Kitchen atenolol (TENORMIN) 25 MG tablet TAKE ONE TABLET BY MOUTH EVERY DAY  90 tablet  1  . carbidopa-levodopa (SINEMET CR) 25-100 MG per tablet Take 1 tablet by mouth 4 (four) times daily.        . Elastic Bandages & Supports (TRUFORM STOCKINGS 10-20MMHG) MISC by Does not apply route.        . entacapone (COMTAN) 200 MG tablet Take 200 mg by mouth. Take one and half tablet by mouth four times a day         . finasteride (PROSCAR) 5 MG tablet       . hydrocortisone 1 % ointment       . pravastatin (PRAVACHOL) 20 MG tablet       . Tamsulosin HCl (FLOMAX) 0.4 MG CAPS Take 0.4 mg by mouth 2 (two) times daily.       Marland Kitchen dutasteride (AVODART) 0.5 MG capsule Take 1 capsule (0.5 mg total) by mouth daily.  90 capsule  3  .  lisinopril-hydrochlorothiazide (PRINZIDE,ZESTORETIC) 20-25 MG per tablet TAKE ONE TABLET BY MOUTH EVERY DAY  90 tablet  1  . Potassium 99 MG TABS Take 1 tablet by mouth daily.        . rivastigmine (EXELON) 4.6 mg/24hr Place 2 patches (9.2 mg total) onto the skin every morning.  49 patch  0   Facility-Administered Encounter Medications as of 07/02/2012  Medication Dose Route Frequency  Provider Last Rate Last Dose  . Influenza (>/= 3 years) inactive virus vaccine (FLVIRIN/FLUZONE) injection SUSP 0.5 mL  0.5 mL Intramuscular Once Kerri Perches, MD      :  Review of Systems  HENT: Positive for hearing loss.   Respiratory: Positive for shortness of breath.   Psychiatric/Behavioral: Positive for dysphoric mood.    Objective:  Neurologic Exam  Physical Exam Physical Examination:   Filed Vitals:   07/02/12 1210  BP: 100/66  Pulse: 67  Temp: 98.6 F (37 C)    General Examination: The patient is a very pleasant 79 y.o. male in no acute distress. He is calm and cooperative with the exam. He denies Visual Hallucinations. He is situated in his WC.  HEENT: Normocephalic, atraumatic, pupils are equal, round and reactive to light and accommodation. Funduscopic exam is normal with sharp disc margins noted. Extraocular tracking shows moderate saccadic breakdown without nystagmus noted. Hearing is impaired. Tympanic membranes are clear bilaterally. Face is symmetric with moderate facial masking and normal facial sensation. There is no lip, neck or jaw tremor. Neck is severely rigid with intact passive ROM. There are no carotid bruits on auscultation.   Chest: is clear to auscultation without wheezing, rhonchi or crackles noted.  Heart: sounds are regular and normal without murmurs, rubs or gallops noted.   Abdomen: is soft, non-tender and non-distended with normal bowel sounds appreciated on auscultation.  Extremities: There is no pitting edema in the distal lower extremities bilaterally.    Skin: is warm and dry with no trophic changes noted. He has a bandage over his left forearm.  Musculoskeletal: exam reveals no obvious joint deformities, tenderness or joint swelling or erythema.  Neurologically:  Mental status: The patient is awake and alert, paying fair  attention. He is unable to provide much in the way of history. He is oriented to: person, place, situation, month of year and year. His memory, attention, language and knowledge are impaired. There is no aphasia, agnosia, apraxia or anomia. There is a mild degree of bradyphrenia. Speech is mildly hypophonic with mild dysarthria noted. Mood is congruent and affect seems normal.   Cranial nerves are as described above under HEENT exam. In addition, shoulder shrug is normal with equal shoulder height noted.  Motor exam: Normal bulk, and strength for age is noted. Tone is mildly rigid with absence of cogwheeling in the bilateral extremities. There is overall mild bradykinesia. There is no drift or rebound. There is no tremor. Reflexes are 1+ in the upper extremities and 1+ in the lower extremities. Toes are downgoing bilaterally. Fine motor skills: Finger taps, hand movements, and rapid alternating patting are mildly impaired bilaterally. Foot taps and foot agility are mildly impaired bilaterally.   Cerebellar testing shows no dysmetria or intention tremor on finger to nose testing. There is no truncal or gait ataxia.   Sensory exam is intact to light touch, pinprick, vibration, temperature sense and proprioception in the upper and lower extremities.   Gait, station and balance: He stands up from the seated position with severe difficulty and needs maximum assistance. He has a tendency to fall backwards and slumps back into his wheelchair. He was not able to walk for me today. Posture was noted to be moderate to significantly stooped.    Assessment and Plan:    Assessment and Plan:  In summary, DEMAURION DICIOCCIO is a very  pleasant 79 y.o.-year old male with a history of gait disorder, which is most likely multifactorial including  secondary to abnormal posture and chronic back disease. He has parkinsonism without much in the way of lateralization, he also has dementia. When I compare my findings today with Earl Hayes last note, the patient appears to be fairly stable. He is at fall risk.   I had a long chat with the patient and Earl Hayes about my findings and the diagnosis, its prognosis and treatment options. We talked about medical treatments and non-pharmacological approaches. We talked about the patient's fall risk and I recommended the following at this time: I did not make any changes to his medications, the patient is not safe to walk by himself even with a walker. He needs maximum assistance with standing, walking and transfer at this time. I answered [facility]'s questions today. I would like to see him back in 6 months from now, sooner if the need arises and encouraged the NH staff to call with any interim questions, concerns, problems or updates and refill requests. I also filled out the paperwork from the nursing home today.

## 2012-07-02 NOTE — Patient Instructions (Signed)
I think overall you are doing fairly well but I do want to suggest a few things today: You are not safe to walk on your own even with a walker. You need maximum assistance with standing, walking and transfer. You are at risk for falls. I did not recommend any changes in your medications at this time.   Remember to drink plenty of fluid, eat healthy meals and do not skip any meals. Try to eat protein with a every meal and eat a healthy snack such as fruit or nuts in between meals. Try to keep a regular sleep-wake schedule and try to exercise your legs while sitting.    As far as your medications are concerned, I would like to suggest no new changes.   I would like to see you back in 6 months, sooner if we need to. Please call us with any interim questions, concerns, problems, updates or refill requests.  Brett Canales is my clinical assistant and will answer any of your questions and relay your messages to me and also relay most of my messages to you.  Our phone number is [phone]. We also have an after hours call service for urgent matters and there is a physician on-call for urgent questions. For any emergencies you know to call 911 or go to the nearest emergency room.

## 2012-08-07 ENCOUNTER — Other Ambulatory Visit (HOSPITAL_COMMUNITY): Payer: Self-pay | Admitting: Internal Medicine

## 2012-08-07 DIAGNOSIS — R52 Pain, unspecified: Secondary | ICD-10-CM

## 2012-08-07 DIAGNOSIS — M25871 Other specified joint disorders, right ankle and foot: Secondary | ICD-10-CM

## 2012-08-08 ENCOUNTER — Ambulatory Visit (HOSPITAL_COMMUNITY): Payer: Medicare Other

## 2012-08-13 ENCOUNTER — Ambulatory Visit (HOSPITAL_COMMUNITY): Payer: Medicare Other

## 2012-08-15 ENCOUNTER — Ambulatory Visit (HOSPITAL_COMMUNITY): Payer: Medicare Other

## 2012-08-19 ENCOUNTER — Other Ambulatory Visit (HOSPITAL_COMMUNITY): Payer: Self-pay | Admitting: Internal Medicine

## 2012-08-19 DIAGNOSIS — M25871 Other specified joint disorders, right ankle and foot: Secondary | ICD-10-CM

## 2012-08-20 ENCOUNTER — Other Ambulatory Visit (HOSPITAL_COMMUNITY): Payer: Self-pay | Admitting: Internal Medicine

## 2012-08-20 ENCOUNTER — Ambulatory Visit (HOSPITAL_COMMUNITY): Payer: Medicare Other

## 2012-08-21 ENCOUNTER — Other Ambulatory Visit (HOSPITAL_COMMUNITY): Payer: Self-pay | Admitting: Pulmonary Disease

## 2012-08-22 ENCOUNTER — Ambulatory Visit (HOSPITAL_COMMUNITY)
Admission: RE | Admit: 2012-08-22 | Discharge: 2012-08-22 | Disposition: A | Discharge status: Home / Self Care | Payer: Medicare Other | Source: Ambulatory Visit | Attending: Internal Medicine | Admitting: Internal Medicine

## 2012-08-22 ENCOUNTER — Other Ambulatory Visit (HOSPITAL_COMMUNITY): Payer: Self-pay | Admitting: Internal Medicine

## 2012-08-22 DIAGNOSIS — M25871 Other specified joint disorders, right ankle and foot: Secondary | ICD-10-CM

## 2012-08-22 MED ORDER — LIDOCAINE HCL (PF) 1 % IJ SOLN
INTRAMUSCULAR | Status: AC
  Filled 2012-08-22: qty 10

## 2012-08-28 ENCOUNTER — Encounter: Payer: Self-pay | Admitting: Orthopedic Surgery

## 2012-08-28 ENCOUNTER — Ambulatory Visit: Payer: Medicare Other | Admitting: Orthopedic Surgery

## 2012-08-29 ENCOUNTER — Ambulatory Visit (HOSPITAL_COMMUNITY): Admission: RE | Admit: 2012-08-29 | Payer: Medicare Other | Source: Ambulatory Visit

## 2012-09-02 ENCOUNTER — Ambulatory Visit (HOSPITAL_COMMUNITY)
Admission: RE | Admit: 2012-09-02 | Discharge: 2012-09-02 | Disposition: A | Discharge status: Home / Self Care | Payer: Medicare Other | Source: Ambulatory Visit | Attending: Internal Medicine | Admitting: Internal Medicine

## 2012-09-02 DIAGNOSIS — M25579 Pain in unspecified ankle and joints of unspecified foot: Secondary | ICD-10-CM | POA: Insufficient documentation

## 2012-09-02 DIAGNOSIS — M25871 Other specified joint disorders, right ankle and foot: Secondary | ICD-10-CM

## 2012-09-02 DIAGNOSIS — M65979 Unspecified synovitis and tenosynovitis, unspecified ankle and foot: Secondary | ICD-10-CM | POA: Insufficient documentation

## 2012-09-02 DIAGNOSIS — M659 Synovitis and tenosynovitis, unspecified: Secondary | ICD-10-CM | POA: Insufficient documentation

## 2012-12-09 ENCOUNTER — Ambulatory Visit (INDEPENDENT_AMBULATORY_CARE_PROVIDER_SITE_OTHER): Payer: Medicare Other | Admitting: Urology

## 2012-12-09 DIAGNOSIS — R339 Retention of urine, unspecified: Secondary | ICD-10-CM

## 2012-12-09 DIAGNOSIS — N401 Enlarged prostate with lower urinary tract symptoms: Secondary | ICD-10-CM

## 2012-12-09 DIAGNOSIS — R972 Elevated prostate specific antigen [PSA]: Secondary | ICD-10-CM

## 2012-12-23 ENCOUNTER — Emergency Department (HOSPITAL_COMMUNITY): Payer: Medicare Other

## 2012-12-23 ENCOUNTER — Emergency Department (HOSPITAL_COMMUNITY)
Admission: EM | Admit: 2012-12-23 | Discharge: 2012-12-23 | Disposition: A | Discharge status: Other Acute Inpatient Hospital | Payer: Medicare Other | Attending: Emergency Medicine | Admitting: Emergency Medicine

## 2012-12-23 DIAGNOSIS — R079 Chest pain, unspecified: Secondary | ICD-10-CM

## 2012-12-23 DIAGNOSIS — Z8719 Personal history of other diseases of the digestive system: Secondary | ICD-10-CM | POA: Insufficient documentation

## 2012-12-23 DIAGNOSIS — I2510 Atherosclerotic heart disease of native coronary artery without angina pectoris: Secondary | ICD-10-CM | POA: Insufficient documentation

## 2012-12-23 DIAGNOSIS — Z7982 Long term (current) use of aspirin: Secondary | ICD-10-CM | POA: Insufficient documentation

## 2012-12-23 DIAGNOSIS — R609 Edema, unspecified: Secondary | ICD-10-CM | POA: Insufficient documentation

## 2012-12-23 DIAGNOSIS — G20 Parkinson's disease: Secondary | ICD-10-CM | POA: Insufficient documentation

## 2012-12-23 DIAGNOSIS — H9190 Unspecified hearing loss, unspecified ear: Secondary | ICD-10-CM | POA: Insufficient documentation

## 2012-12-23 DIAGNOSIS — N401 Enlarged prostate with lower urinary tract symptoms: Secondary | ICD-10-CM | POA: Insufficient documentation

## 2012-12-23 DIAGNOSIS — R0789 Other chest pain: Secondary | ICD-10-CM | POA: Insufficient documentation

## 2012-12-23 DIAGNOSIS — F0390 Unspecified dementia without behavioral disturbance: Secondary | ICD-10-CM | POA: Insufficient documentation

## 2012-12-23 DIAGNOSIS — I10 Essential (primary) hypertension: Secondary | ICD-10-CM | POA: Insufficient documentation

## 2012-12-23 DIAGNOSIS — I498 Other specified cardiac arrhythmias: Secondary | ICD-10-CM | POA: Insufficient documentation

## 2012-12-23 DIAGNOSIS — Z8669 Personal history of other diseases of the nervous system and sense organs: Secondary | ICD-10-CM | POA: Insufficient documentation

## 2012-12-23 DIAGNOSIS — G20A1 Parkinson's disease without dyskinesia, without mention of fluctuations: Secondary | ICD-10-CM | POA: Insufficient documentation

## 2012-12-23 DIAGNOSIS — E785 Hyperlipidemia, unspecified: Secondary | ICD-10-CM | POA: Insufficient documentation

## 2012-12-23 DIAGNOSIS — Z87891 Personal history of nicotine dependence: Secondary | ICD-10-CM | POA: Insufficient documentation

## 2012-12-23 DIAGNOSIS — N138 Other obstructive and reflux uropathy: Secondary | ICD-10-CM | POA: Insufficient documentation

## 2012-12-23 DIAGNOSIS — Z79899 Other long term (current) drug therapy: Secondary | ICD-10-CM | POA: Insufficient documentation

## 2012-12-23 LAB — BASIC METABOLIC PANEL
BUN: 13 mg/dL (ref 6–23)
CO2: 33 mEq/L — ABNORMAL HIGH (ref 19–32)
Calcium: 9.2 mg/dL (ref 8.4–10.5)
Chloride: 96 mEq/L (ref 96–112)
Creatinine, Ser: 0.63 mg/dL (ref 0.50–1.35)
GFR calc Af Amer: >90 mL/min (ref >90)
GFR calc non Af Amer: 90 mL/min — ABNORMAL LOW (ref >90)
Glucose, Bld: 97 mg/dL (ref 70–99)
Potassium: 3.7 mEq/L (ref 3.5–5.1)
Sodium: 135 mEq/L (ref 135–145)

## 2012-12-23 LAB — CBC WITH DIFFERENTIAL/PLATELET
Basophils Absolute: 0 10*3/uL (ref 0.0–0.1)
Basophils Relative: 0 % (ref 0–1)
Eosinophils Absolute: 0.2 10*3/uL (ref 0.0–0.7)
Eosinophils Relative: 3 % (ref 0–5)
HCT: 45.9 % (ref 39.0–52.0)
Hemoglobin: 15 g/dL (ref 13.0–17.0)
Lymphocytes Relative: 13 % (ref 12–46)
Lymphs Abs: 0.9 10*3/uL (ref 0.7–4.0)
MCH: 29.5 pg (ref 26.0–34.0)
MCHC: 32.7 g/dL (ref 30.0–36.0)
MCV: 90.2 fL (ref 78.0–100.0)
Monocytes Absolute: 1 10*3/uL (ref 0.1–1.0)
Monocytes Relative: 15 % — ABNORMAL HIGH (ref 3–12)
Neutro Abs: 4.5 10*3/uL (ref 1.7–7.7)
Neutrophils Relative %: 68 % (ref 43–77)
Platelets: 172 10*3/uL (ref 150–400)
RBC: 5.09 MIL/uL (ref 4.22–5.81)
RDW: 14.9 % (ref 11.5–15.5)
WBC: 6.5 10*3/uL (ref 4.0–10.5)

## 2012-12-23 LAB — TROPONIN I: Troponin I: <0.3 ng/mL (ref <0.30)

## 2012-12-23 MED ORDER — ASPIRIN 81 MG PO CHEW: 324.0000 mg | CHEWABLE_TABLET | Freq: Once | ORAL | Status: DC

## 2012-12-23 NOTE — ED Provider Notes (Signed)
CSN: [id number]     Arrival date & time 12/23/12  1535 History  This chart was scribed for Earl Razor, MD by Bennett Scrape, ED Scribe. This patient was seen in room APA04/APA04 and the patient's care was started at 3:58 PM.   Chief Complaint  Patient presents with  . Chest Pain   Level 5 Caveat-Dementia  The history is provided by the patient. No language interpreter was used.    HPI Comments: Earl [facility] is a 79 y.o. male with a h/o Parkinson's brought in by ambulance from [facility], who presents to the Emergency Department complaining of CP described as "shooting pains into my heart" that occurred PTA. When asked if he was SOB, nauseated or diaphoretic, the pt states "I don't know". He denies having any pain currently. Wife, who also lives at [facility], states that the pt was sitting in the bathroom when he started complaining. She states that he had a "sweaty, funny" look which prompted her to then call EMS.  Wife admits that the pt has not "felt good" over the past two days but is unable to elaborate. She states that the pt has "little, light" pains in his chest occasionally but none this severe. She states that the pt is seen at [facility] and [facility].    Past Medical History  Diagnosis Date  . Sleep apnea, obstructive     no cpap  . Abnormality of gait   . Hearing loss   . Dementia   . CAD (coronary artery disease)     non obstructive   . Hypertension   . Parkinson disease   . BPH with obstruction/lower urinary tract symptoms   . ED (erectile dysfunction)   . Tortuous colon   . Hyperlipidemia   . Gait disorder 07/02/2012  . Parkinsonism 07/02/2012   Past Surgical History  Procedure Laterality Date  . Appendectomy    . Tonsillectomy    . Insertion of male prothesis    . Knee surgery      both  . Shoulder surgery      Right   Family History  Problem Relation Age of Onset  . Colon cancer Father   . Kidney disease Sister   . Dementia Sister     History  Substance Use Topics  . Smoking status: Former Smoker -- 1.00 packs/day for 47 years    Types: Cigarettes    Quit date: 04/02/1985  . Smokeless tobacco: Not on file  . Alcohol Use: No    Review of Systems  Unable to perform ROS: Dementia    Allergies  Review of patient's allergies indicates no known allergies.  Home Medications   Current Outpatient Rx  Name  Route  Sig  Dispense  Refill  . ALPRAZolam (XANAX) 0.5 MG tablet               . amantadine (SYMMETREL) 100 MG capsule   Oral   Take 100 mg by mouth daily.           Marland Kitchen atenolol (TENORMIN) 25 MG tablet      TAKE ONE TABLET BY MOUTH EVERY DAY   90 tablet   1   . carbidopa-levodopa (SINEMET CR) 25-100 MG per tablet   Oral   Take 1 tablet by mouth 4 (four) times daily.           Marland Kitchen dutasteride (AVODART) 0.5 MG capsule   Oral   Take 1 capsule (0.5 mg total) by mouth daily.  90 capsule   3   . Elastic Bandages & Supports (TRUFORM STOCKINGS 10-20MMHG) MISC   Does not apply   by Does not apply route.           . entacapone (COMTAN) 200 MG tablet   Oral   Take 200 mg by mouth. Take one and half tablet by mouth four times a day            . finasteride (PROSCAR) 5 MG tablet               . hydrocortisone 1 % ointment               . lisinopril-hydrochlorothiazide (PRINZIDE,ZESTORETIC) 20-25 MG per tablet      TAKE ONE TABLET BY MOUTH EVERY DAY   90 tablet   1   . Potassium 99 MG TABS   Oral   Take 1 tablet by mouth daily.           . pravastatin (PRAVACHOL) 20 MG tablet               . rivastigmine (EXELON) 4.6 mg/24hr   Transdermal   Place 2 patches (9.2 mg total) onto the skin every morning.   49 patch   0     [id number] Exp-07/2012   . Tamsulosin HCl (FLOMAX) 0.4 MG CAPS   Oral   Take 0.4 mg by mouth 2 (two) times daily.           Triage Vitals: BP 113/67  Pulse 54  Temp(Src) 97.8 F (36.6 C) (Oral)  Resp 25  SpO2 96%  Physical Exam  Nursing  note and vitals reviewed. Constitutional: He appears well-developed and well-nourished. No distress.  HENT:  Head: Normocephalic and atraumatic.  Eyes: EOM are normal.  Neck: Neck supple. No tracheal deviation present.  Cardiovascular: Regular rhythm.  Bradycardia present.   Pulmonary/Chest: Effort normal and breath sounds normal. No respiratory distress.  Musculoskeletal: Normal range of motion. He exhibits edema (mild symmetric pitting lower extremity edema).  Neurological: He is alert.  Slow speech and movements consistent with h/o Parkinson's   Skin: Skin is warm and dry.  Psychiatric: He has a normal mood and affect. His behavior is normal.    ED Course  Procedures (including critical care time)  DIAGNOSTIC STUDIES: Oxygen Saturation is 96% on room air, normal by my interpretation.    COORDINATION OF CARE: 4:05 PM-Discussed treatment plan which includes CXR, CBC panel, BMP and UA with pt at bedside and pt agreed to plan.   Labs Review Labs Reviewed  CBC WITH DIFFERENTIAL - Abnormal; Notable for the following:    Monocytes Relative 15 (*)    All other components within normal limits  BASIC METABOLIC PANEL - Abnormal; Notable for the following:    CO2 33 (*)    GFR calc non Af Amer 90 (*)    All other components within normal limits  TROPONIN I   Imaging Review No results found.  EKG:  Rhythm: sinus bradycardia Vent. rate 54 BPM PR interval 216 ms QRS duration 94 ms QT/QTc 430/407 ms Abnormal r wave progression ST segments: NS ST changes   Ct Head Wo Contrast  12/23/2012   CLINICAL DATA:  Diaphoretic and acting abnormal. Dementia.  EXAM: CT HEAD WITHOUT CONTRAST  TECHNIQUE: Contiguous axial images were obtained from the base of the skull through the vertex without intravenous contrast.  COMPARISON:  01/04/2006 MR.  FINDINGS: No intracranial hemorrhage.  Atrophy with prominent subarachnoid  spaces similar to the prior MR.  Small vessel disease type changes without CT  evidence of large acute infarct.  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  No intracranial mass lesion noted on this unenhanced exam. .  Partial opacification left frontal sinus with fluid level raising possibility of acute sinusitis.  IMPRESSION: No intracranial hemorrhage.  Atrophy with prominent subarachnoid spaces similar to the prior MR.  Small vessel disease type changes without CT evidence of large acute infarct.  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  No intracranial mass lesion noted on this unenhanced exam. .  Partial opacification left frontal sinus with fluid level raising possibility of acute sinusitis   Electronically Signed   By: Bridgett Larsson   On: 12/23/2012 17:16   Dg Chest Portable 1 View  12/23/2012   *RADIOLOGY REPORT*  Clinical Data: Chest pain with dementia.  PORTABLE CHEST - 1 VIEW  Comparison: 08/04/2010.  Findings:  Low lung volumes.  Cardiomegaly.  Mild vascular congestion.  No overt failure or focal infiltrates. Mild vascular congestion.  Mild bibasilar scarring or subsegmental atelectasis. No effusion or pneumothorax.  Advanced degenerative change right shoulder.  Worsening aeration from priors.  IMPRESSION: Cardiomegaly with low lung volumes. Mild vascular congestion. Worsening aeration from priors.   Original Report Authenticated By: Davonna Belling, M.D.   MDM   1. Chest pain    Atypical CP. Doubt ACS. W/u reassuring. Doubt infectious, PE or other emergent etiology.   I personally preformed the services scribed in my presence. The recorded information has been reviewed is accurate. Earl Razor, MD.     Earl Razor, MD 12/30/12 [phone]

## 2012-12-23 NOTE — ED Notes (Signed)
D/c papers given to niece to give to [facility]. D/c instructions called to [facility] RN at [facility]

## 2012-12-23 NOTE — ED Notes (Signed)
[facility] called for transport back to [facility]. 

## 2012-12-23 NOTE — ED Notes (Signed)
Nurse at [facility] speech is garbled and more confused than usual. States usually can understand what he says. States the ntg sl was ineffective and face turned red. Nurse Eloisa Northern. Eloisa Northern also states he was only given one aspirin of 325 and not 2 tabs. EDP aware. Niece in room and states his speech is no different to her. Pt unable to follow most commands or answer simple questions, will make a grimace on face like he is confused

## 2012-12-23 NOTE — ED Notes (Signed)
Pt from [facility]. EMS called due to CP. Was given one ntg sl and 650mg  aspirin at [facility]. Pt arrived alert. Confusion noted. Unable to give date/time/day. Pt in nad. Nondiaphoretic. Pt states had pain " to my heart three times today'. Denies any pain now. States was an ache and only lasted a second. Asked if he was sob/n/v/d and pt said "i dont know'.

## 2013-01-01 ENCOUNTER — Ambulatory Visit (INDEPENDENT_AMBULATORY_CARE_PROVIDER_SITE_OTHER): Payer: PRIVATE HEALTH INSURANCE | Admitting: Neurology

## 2013-01-01 ENCOUNTER — Encounter: Payer: Self-pay | Admitting: Neurology

## 2013-01-01 VITALS — BP 108/66 | HR 51 | Temp 97.9°F | Ht 64.0 in | Wt 201.0 lb

## 2013-01-01 DIAGNOSIS — G20 Parkinson's disease: Secondary | ICD-10-CM

## 2013-01-01 DIAGNOSIS — F0390 Unspecified dementia without behavioral disturbance: Secondary | ICD-10-CM

## 2013-01-01 DIAGNOSIS — R269 Unspecified abnormalities of gait and mobility: Secondary | ICD-10-CM

## 2013-01-01 NOTE — Progress Notes (Signed)
Subjective:    Patient ID: Earl Hayes is a 79 y.o. male.  HPI  Interim history:   Earl Hayes is a pleasant 79 year old right-handed gentleman who presents for followup consultation of his dementia, gait disorder, and parkinsonism. He is unaccompanied today and unable to provide a Hx. I first met him on 07/02/2012, which time I felt he had a gait this order, most likely multifactorial in etiology including secondary to abnormal posture and chronic back disease. In addition I felt he had parkinsonism without much in the way of lateralization, complicated by dementia. I felt he was overall stable but at risk for falls. His friend reported a recent fall. Apparently, the patient was reaching for some medicine that had fallen down and he and his wife both fell. He injured his left forearm, but did not hit his head or had any loss of consciousness. The patient is situated in his wheelchair and is again noted to be very hard of hearing. I did not make any medication changes at the time of his last visit. I felt the patient was not safe to walk by himself even with a walker. He needed maximum assistance with standing and walking and transferring upon my exam. He resides at a nursing home, but there is no-one with him and he was dropped off by a driver. I do not have a consultation form to fill out and the NH sent the [facility], but no update in his Hx.  He is a former patient of Dr. Imagene Gurney and followed with him since 2008. He has an underlying history of coronary artery disease, hypertension, hyperlipidemia, chronic lumbar spinal stenosis, and memory loss with parkinsonism and gait dysfunction. Dr. Sandria Manly had tried him on Sinemet and Exelon with poor response. Requip was tried without benefit. He was last seen by Dr. Sandria Manly on 03/10/2012 at which time he he was advised to continue with Sinemet and Exelon patch, and use his walker at all times. He currently resides at [facility] nursing [facility]. I reviewed their records.  His current medications are: Amantadine 100 mg once daily, baby aspirin once daily, atenolol 25 mg once daily, Exelon patch 9.5 mg once daily, finasteride 5 mg once daily, Flomax 0.4 mg once daily, lisinopril-hydrochlorothiazide 20-25 mg once daily, pravastatin 20 mg at night, senna once daily, entacapone 200 mg half a tablet 4 times a day, Sinemet 25/100 mg strength 2 tablets 4 times a day, at 5 AM, 9 AM, 1 PM and 5 PM, Xanax 0.5 mg as needed for sleep.  There is a family history of Alzheimer's disease in his sister. He was placed on Sinemet plus Comtan with some response. He has a history of low back pain and left posterior thigh pain. MRI L spine on 11/27/2006 showed severe central stenosis at L4-5 secondary to ligamentum flavum hypertrophy, facet hypertrophy., degenerative disease and bulging. He also had moderately severe stenosis at L3-4 and to a lesser extent L2-3. There is evidence of spondylosis at T11-L1 and neuroforaminal stenosis at L2-3 through L5-S1. There is also evidence of facet arthropathy at L2-3 through L5-S1. He received a single shot of epidural steroids and has done well for 3 years with his pain. He has had balance issues. He has no bowel or bladder dysfunction. He denies numbness. Repeat L spine MRI on 11/01/08 was without change. He received epidural steroids on 08/02/09 with excellent results. Plain lumbar spine film on 06/02/09 with diffuse DDD and osteopenia at L2-L3 L5-S1. He is very hard of hearing.  His power of attorney is his pastor at church. He an his wife reside at [facility]. MMSE on 11/08/11: 28/30, AFT10, CDT 2/4.   His Past Medical History Is Significant For: Past Medical History  Diagnosis Date  . Sleep apnea, obstructive     no cpap  . Abnormality of gait   . Hearing loss   . Dementia   . CAD (coronary artery disease)     non obstructive   . Hypertension   . Parkinson disease   . BPH with obstruction/lower urinary tract symptoms   . ED (erectile dysfunction)   .  Tortuous colon   . Hyperlipidemia   . Gait disorder 07/02/2012  . Parkinsonism 07/02/2012    His Past Surgical History Is Significant For: Past Surgical History  Procedure Laterality Date  . Appendectomy    . Tonsillectomy    . Insertion of male prothesis    . Knee surgery      both  . Shoulder surgery      Right    His Family History Is Significant For: Family History  Problem Relation Age of Onset  . Colon cancer Father   . Kidney disease Sister   . Dementia Sister     His Social History Is Significant For: History   Social History  . Marital Status: Married    Spouse Name: Earl Hayes    Number of Children: 3  . Years of Education: N/A   Occupational History  . 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 Smoking status: Former Smoker -- 1.00 packs/day for 47 years    Types: Cigarettes    Quit date: 04/02/1985  . Smokeless tobacco: None  . Alcohol Use: No  . Drug Use: No  . Sexual Activity: None   Other Topics Concern  . None   Social History Narrative  . None    His Allergies Are:  No Known Allergies:   His Current Medications Are:  Outpatient Encounter Prescriptions as of 01/01/2013  Medication Sig Dispense Refill  . acetaminophen (TYLENOL) 325 MG tablet Take 650 mg by mouth every 6 (six) hours as needed for pain.      Marland Kitchen ALPRAZolam (XANAX) 0.5 MG tablet Take 0.5 mg by mouth at bedtime as needed for sleep.       Marland Kitchen amantadine (SYMMETREL) 100 MG capsule Take 100 mg by mouth daily.        Marland Kitchen aspirin EC 81 MG tablet Take 81 mg by mouth daily.      Marland Kitchen atenolol (TENORMIN) 25 MG tablet Take 25 mg by mouth daily.      . carbidopa-levodopa (SINEMET CR) 25-100 MG per tablet Take 2 tablets by mouth 4 (four) times daily.       Marland Kitchen dextromethorphan-guaiFENesin (ROBITUSSIN-DM) 10-100 MG/5ML liquid Take 5 mLs by mouth every 4 (four) hours as needed for cough.      . docusate sodium (COLACE) 100 MG capsule Take 100 mg by  mouth daily.      . entacapone (COMTAN) 200 MG tablet Take 100 mg by mouth 4 (four) times daily.       . finasteride (PROSCAR) 5 MG tablet Take 5 mg by mouth daily.       . furosemide (LASIX) 20 MG tablet Take 20 mg by mouth every 3 (three) days.      Marland Kitchen ipratropium-albuterol (DUONEB) 0.5-2.5 (3) MG/3ML SOLN Take 3 mLs by nebulization 3 (three)  times daily.      Marland Kitchen lisinopril-hydrochlorothiazide (PRINZIDE,ZESTORETIC) 20-12.5 MG per tablet Take 1 tablet by mouth daily.      . Nasal Dilators STRP Apply topically at bedtime. Applied to the bridge of the nose (Size SM/MD)      . nitroGLYCERIN (NITROSTAT) 0.4 MG SL tablet Place 0.4 mg under the tongue as needed for chest pain.      Marland Kitchen oxyCODONE-acetaminophen (PERCOCET/ROXICET) 5-325 MG per tablet Take 1 tablet by mouth every 6 (six) hours as needed for pain.      . pravastatin (PRAVACHOL) 10 MG tablet Take 10 mg by mouth at bedtime.      Marland Kitchen Propylene Glycol (SYSTANE BALANCE) 0.6 % SOLN Apply 1 drop to eye 2 (two) times daily.      . rivastigmine (EXELON) 9.5 mg/24hr Place 1 patch onto the skin daily. Patch is applied at 8:59am and removed the following day at 9:00am      . senna (SENOKOT) 8.6 MG tablet Take 1 tablet by mouth daily.      . Tamsulosin HCl (FLOMAX) 0.4 MG CAPS Take 0.4 mg by mouth daily.       Marland Kitchen ketoconazole (NIZORAL) 2 % cream Apply 1 application topically 2 (two) times daily. Applied to the face      . ketoconazole (NIZORAL) 2 % shampoo Apply 1 application topically every Wednesday and Saturday. Applied to scalp, chest, and face.      . nystatin (MYCOSTATIN) powder Apply topically every 14 (fourteen) days. Applied to the scrotum and groin       Facility-Administered Encounter Medications as of 01/01/2013  Medication Dose Route Frequency Provider Last Rate Last Dose  . Influenza (>/= 3 years) inactive virus vaccine (FLVIRIN/FLUZONE) injection SUSP 0.5 mL  0.5 mL Intramuscular Once Kerri Perches, MD        Review of Systems  Unable to  perform ROS   Objective:  Neurologic Exam  Physical Exam Physical Examination:   Filed Vitals:   01/01/13 1438  BP: 108/66  Pulse: 51  Temp: 97.9 F (36.6 C)    General Examination: The patient is situated in his WC.  HEENT: Normocephalic, atraumatic, pupils are equal, round and reactive to light and accommodation. Extraocular tracking shows moderate saccadic breakdown without nystagmus noted. Hearing is highly impaired with hearing aid on R and none in L ear. Face is symmetric with moderate facial masking and normal facial sensation. There is no lip, neck or jaw tremor. Neck is severely rigid with intact passive ROM. There are no carotid bruits on auscultation.   Chest: is clear to auscultation without wheezing, rhonchi or crackles noted.  Heart: sounds are regular and normal without murmurs, rubs or gallops noted.   Abdomen: is soft, non-tender and non-distended with normal bowel sounds appreciated on auscultation.  Extremities: There is 1+ pitting edema in the distal lower extremities bilaterally.   Skin: is dry with no trophic changes noted.   Musculoskeletal: exam reveals no obvious joint deformities, tenderness or joint swelling or erythema.  Neurologically:  Mental status: The patient is awake and alert, paying fair attention. He is unable to provide the history. He is oriented to: person, place, situation, month and year. His memory, attention, language and knowledge are impaired. There is no aphasia, agnosia, apraxia or anomia. There is a mild degree of bradyphrenia. Speech is moderately hypophonic with mild dysarthria noted. Mood is congruent and affect seems normal.   Cranial nerves are as described above under HEENT exam. In addition,  shoulder shrug is normal with equal shoulder height noted.  Motor exam: Normal bulk, and strength for age is noted. Tone is mildly rigid with absence of cogwheeling in the bilateral extremities. There is overall mild bradykinesia. There  is no drift or rebound. There is no tremor. Reflexes are 1+ in the upper extremities and absent in the lower extremities. Fine motor skills: Finger taps, hand movements, and rapid alternating patting are mildly impaired bilaterally. Foot taps and foot agility are mildly impaired bilaterally.   Cerebellar testing shows no dysmetria or intention tremor on finger to nose testing. There is no truncal or gait ataxia.   Sensory exam is intact to light touch, pinprick, vibration, temperature sense and proprioception in the upper and lower extremities.   Gait, station and balance: I did not ask him to stand or walk today as there was no assistance. He was unable to walk for me last time.   Assessment and Plan:    In summary, Earl Hayes is a very pleasant 79 year old male with a history of severe gait disorder, parkinsonism and dementia. Unfortunately he was not accompanied by anybody from his nursing home today. I also did not have any consultation form to fill out today but will provide his AVS for instructions. He appears to be fairly stable but unfortunately is not able to provide the history. He apparently has not had much in the way of medication changes. He continues to be at severe fall risk. I think that is his biggest problem. He is very hard of hearing. I did not suggest any medication changes. I do not believe that this was a very fruitful visit for this patient and have recommended in my paperwork that the patient be accompanied by a caretaker next time. I would like to see him back in 6 months from now, sooner if the need arises and encouraged the NH staff to call with any interim questions, concerns, problems or updates.

## 2013-01-01 NOTE — Patient Instructions (Addendum)
I did not suggest any medication changes today. You continue to be at high risk for falling. Do not stand or walk without assistance. Unfortunately there was no one with you from your nursing home today to provide additional history. I do not think this was a very fruitful visit for you today. I suggest that for your next visit you have an attendant from your nursing home accompany you. I will see her back in 6 months. You appear to be fairly stable on my exam.

## 2013-07-02 ENCOUNTER — Encounter: Payer: Self-pay | Admitting: Neurology

## 2013-07-02 ENCOUNTER — Ambulatory Visit (INDEPENDENT_AMBULATORY_CARE_PROVIDER_SITE_OTHER): Payer: PRIVATE HEALTH INSURANCE | Admitting: Neurology

## 2013-07-02 VITALS — BP 98/64 | HR 88 | Temp 97.8°F

## 2013-07-02 DIAGNOSIS — F0390 Unspecified dementia without behavioral disturbance: Secondary | ICD-10-CM

## 2013-07-02 DIAGNOSIS — G20 Parkinson's disease: Secondary | ICD-10-CM

## 2013-07-02 DIAGNOSIS — R269 Unspecified abnormalities of gait and mobility: Secondary | ICD-10-CM

## 2013-07-02 NOTE — Patient Instructions (Signed)
I am not convinced that you need all the parkinson's medication and would like to suggest streamlining your medication:  Stop amantadine and stop entacapone.  Follow up in 6 months.

## 2013-07-02 NOTE — Progress Notes (Signed)
Subjective:    Patient ID: Earl Hayes is a 80 y.o. male.  HPI    Interim history:   Earl Hayes is a pleasant 80 year old right-handed gentleman with an underlying history of coronary artery disease, hypertension, hyperlipidemia, hearing loss, chronic lumbar spinal stenosis, and memory loss with parkinsonism and gait dysfunction, who presents for followup consultation of his dementia, gait disorder, and parkinsonism. He is accompanied by a staff member from [facility], today. He himself is unable to provide a Hx. I last saw him on 01/01/13, at which time he was unaccompanied by any caretaker and therefore I was not able to get any reliable history. I felt he continued to be in severe fall risk. I reminded him and his caretakers in writing that he should not stand or walk without assistance.  Today, Earl Hayes states, there have been no recent falls and no recent changes in his condition. He does not transfer or walk without assistance. He was in physical therapy.  I first met him on 07/02/2012, which time I felt he had a gait disorder, most likely multifactorial in etiology including secondary to abnormal posture, aging and chronic back disease, parkinsonism without much in the way of lateralization, complicated by dementia. I felt he was overall stable but at risk for falls. His friend reported a recent fall. He injured his left forearm, but did not hit his head or had any loss of consciousness. I did not make any medication changes at the time of his last visit. I felt the patient was not safe to walk by himself, even with a walker. He needed maximum assistance with standing and walking and transferring upon my exam. He resides at a nursing home. I did not have a consultation form to fill out and the NH sent the [facility], but no update in his Hx.  He is a former patient of Dr. Tressia Danas and followed with him since 2008. Dr. Erling Cruz had tried him on Sinemet and Exelon with poor response. Requip was tried  without benefit. He was last seen by Dr. Erling Cruz on 03/10/2012 at which time he he was advised to continue with Sinemet and Exelon patch, and use his walker at all times. He currently resides at [facility] home. His has been on Amantadine 100 mg once daily, baby aspirin once daily, atenolol 25 mg once daily, Exelon patch 9.5 mg once daily, finasteride 5 mg once daily, Flomax 0.4 mg once daily, lisinopril-hydrochlorothiazide 20-25 mg once daily, pravastatin 20 mg at night, senna once daily, entacapone 200 mg half a tablet 4 times a day, Sinemet 25/100 mg strength 2 tablets 4 times a day, at 5 AM, 9 AM, 1 PM and 5 PM, Xanax 0.5 mg as needed for sleep.  There is a family history of Alzheimer's disease in his sister. He was placed on Sinemet plus Comtan with some response. He has a history of low back pain and left posterior thigh pain. MRI L spine on 11/27/2006 showed severe central stenosis at L4-5 secondary to ligamentum flavum hypertrophy, facet hypertrophy., degenerative disease and bulging. He also had moderately severe stenosis at L3-4 and to a lesser extent L2-3. There is evidence of spondylosis at T11-L1 and neuroforaminal stenosis at L2-3 through L5-S1. There is also evidence of facet arthropathy at L2-3 through L5-S1. He received a single shot of epidural steroids and did well. He has had balance issues. He has no bowel or bladder dysfunction. He denied numbness. Repeat L spine MRI on 11/01/08 was without change. He  received another epidural steroids on 08/02/09 with good results. Plain lumbar spine film on 06/02/09 with diffuse DDD and osteopenia at L2-L3 L5-S1. rd of hearing. His power of attorney is his pastor at church. He an his wife reside at [facility]. MMSE on 11/08/11: 28/30, AFT10, CDT 2/4.    His Past Medical History Is Significant For: Past Medical History  Diagnosis Date  . Sleep apnea, obstructive     no cpap  . Abnormality of gait   . Hearing loss   . Dementia   . CAD (coronary artery  disease)     non obstructive   . Hypertension   . Parkinson disease   . BPH with obstruction/lower urinary tract symptoms   . ED (erectile dysfunction)   . Tortuous colon   . Hyperlipidemia   . Gait disorder 07/02/2012  . Parkinsonism 07/02/2012    His Past Surgical History Is Significant For: Past Surgical History  Procedure Laterality Date  . Appendectomy    . Tonsillectomy    . Insertion of male prothesis    . Knee surgery      both  . Shoulder surgery      Right    His Family History Is Significant For: Family History  Problem Relation Age of Onset  . Colon cancer Father   . Kidney disease Sister   . Dementia Sister     His Social History Is Significant For: History   Social History  . Marital Status: Married    Spouse Name: Ruby    Number of Children: 3  . Years of Education: N/A   Occupational History  . 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 Smoking status: Former Smoker -- 1.00 packs/day for 47 years    Types: Cigarettes    Quit date: 04/02/1985  . Smokeless tobacco: Not on file  . Alcohol Use: No  . Drug Use: No  . Sexual Activity: Not on file   Other Topics Concern  . Not on file   Social History Narrative  . No narrative on file    His Allergies Are:  No Known Allergies:   His Current Medications Are:  Outpatient Encounter Prescriptions as of 07/02/2013  Medication Sig  . acetaminophen (TYLENOL) 325 MG tablet Take 650 mg by mouth every 6 (six) hours as needed for pain.  Marland Kitchen ALPRAZolam (XANAX) 0.5 MG tablet Take 0.5 mg by mouth at bedtime as needed for sleep.   Marland Kitchen amantadine (SYMMETREL) 100 MG capsule Take 100 mg by mouth daily.    Marland Kitchen aspirin EC 81 MG tablet Take 81 mg by mouth daily.  Marland Kitchen atenolol (TENORMIN) 25 MG tablet Take 25 mg by mouth daily.  . carbidopa-levodopa (SINEMET CR) 25-100 MG per tablet Take 2 tablets by mouth 4 (four) times daily.   Marland Kitchen dextromethorphan-guaiFENesin  (ROBITUSSIN-DM) 10-100 MG/5ML liquid Take 5 mLs by mouth every 4 (four) hours as needed for cough.  . docusate sodium (COLACE) 100 MG capsule Take 100 mg by mouth daily.  . entacapone (COMTAN) 200 MG tablet Take 100 mg by mouth 4 (four) times daily.   . finasteride (PROSCAR) 5 MG tablet Take 5 mg by mouth daily.   . furosemide (LASIX) 20 MG tablet Take 20 mg by mouth every 3 (three) days.  Marland Kitchen ipratropium-albuterol (DUONEB) 0.5-2.5 (3) MG/3ML SOLN Take 3 mLs by nebulization 3 (three) times daily.  Marland Kitchen ketoconazole (NIZORAL) 2 % cream Apply  1 application topically 2 (two) times daily. Applied to the face  . ketoconazole (NIZORAL) 2 % shampoo Apply 1 application topically every Wednesday and Saturday. Applied to scalp, chest, and face.  Marland Kitchen lisinopril-hydrochlorothiazide (PRINZIDE,ZESTORETIC) 20-12.5 MG per tablet Take 1 tablet by mouth daily.  . Nasal Dilators STRP Apply topically at bedtime. Applied to the bridge of the nose (Size SM/MD)  . nitroGLYCERIN (NITROSTAT) 0.4 MG SL tablet Place 0.4 mg under the tongue as needed for chest pain.  Marland Kitchen nystatin (MYCOSTATIN) powder Apply topically every 14 (fourteen) days. Applied to the scrotum and groin  . oxyCODONE-acetaminophen (PERCOCET/ROXICET) 5-325 MG per tablet Take 1 tablet by mouth every 6 (six) hours as needed for pain.  . pravastatin (PRAVACHOL) 10 MG tablet Take 10 mg by mouth at bedtime.  Marland Kitchen Propylene Glycol (SYSTANE BALANCE) 0.6 % SOLN Apply 1 drop to eye 2 (two) times daily.  . rivastigmine (EXELON) 9.5 mg/24hr Place 1 patch onto the skin daily. Patch is applied at 8:59am and removed the following day at 9:00am  . senna (SENOKOT) 8.6 MG tablet Take 1 tablet by mouth daily.  . Tamsulosin HCl (FLOMAX) 0.4 MG CAPS Take 0.4 mg by mouth daily.   :  Review of Systems:  Out of a complete 14 point review of systems, all are reviewed and negative with the exception of these symptoms as listed below:  Review of Systems  Constitutional: Negative.   HENT:  Negative.   Eyes: Negative.   Respiratory: Negative.   Cardiovascular: Negative.   Gastrointestinal: Negative.   Endocrine: Negative.   Genitourinary: Negative.   Musculoskeletal: Negative.   Skin: Negative.   Allergic/Immunologic: Negative.   Neurological: Negative.   Hematological: Negative.   Psychiatric/Behavioral: Negative.   All other systems reviewed and are negative.    Objective:  Neurologic Exam  Physical Exam Physical Examination:   Filed Vitals:   07/02/13 1503  BP: 98/64  Pulse: 88  Temp: 97.8 F (36.6 C)   General Examination: The patient is situated in his WC. He appears to be in no acute distress. He is minimally verbal.  HEENT: Normocephalic, atraumatic, pupils are equal, round and reactive to light and accommodation. Extraocular tracking shows moderate saccadic breakdown without nystagmus noted. Hearing is highly impaired with hearing aid on R and none in L ear. Face is symmetric with moderate facial masking and normal facial sensation. There is no lip, neck or jaw tremor. Neck is moderately rigid with intact passive ROM. There are no carotid bruits on auscultation. Speech is scant and dysarthric.  Chest: is clear to auscultation without wheezing, rhonchi or crackles noted.  Heart: sounds are regular and normal without murmurs, rubs or gallops noted.   Abdomen: is soft, non-tender and non-distended with normal bowel sounds appreciated on auscultation.  Extremities: There is trace edema in the ankles bilaterally.   Skin: is dry with no trophic changes noted.   Musculoskeletal: exam reveals no obvious joint deformities, tenderness or joint swelling or erythema.  Neurologically:  Mental status: The patient is awake and alert, paying fair attention. He is unable to provide the history. He is oriented to: person, place, situation, month and year. His memory, attention, language and knowledge are impaired. There is no aphasia, agnosia, apraxia or anomia.  There is a mild degree of bradyphrenia. Speech is moderately hypophonic with mild dysarthria noted. Mood is congruent and affect seems normal.   Cranial nerves are as described above under HEENT exam. In addition, shoulder shrug is normal with equal  shoulder height noted.  Motor exam: Normal bulk, and strength for age is noted. Tone is mildly rigid with absence of cogwheeling in the bilateral extremities. There is overall mild bradykinesia. There is no drift or rebound. There is no tremor. Reflexes are 1+ in the upper extremities and absent in the lower extremities. Fine motor skills: Finger taps, hand movements, and rapid alternating patting, as well as foot taps and foot agility are mild to moderately impaired throughout.    Cerebellar testing shows no dysmetria or intention tremor on finger to nose testing. There is no truncal or gait ataxia.   Sensory exam is intact to light touch in the upper and lower extremities.   Gait, station and balance: I did not ask him to stand or walk today. He was unable to walk for me last time.   Assessment and Plan:    In summary, Earl Hayes is an 80 year old male with a history of severe gait disorder, parkinsonism and dementia. Unfortunately, he is not able to provide a history. He has non-lateralizing parkinsonism and at this point, I am not convinced that he needs that much parkinsonian medication. To that and, I would like to recommend taking him off of amantadine which is currently 100 mg once daily as well as taking him off of Comtan which is currently 100 mg 4 times a day which is half a pill qid. I would suggest no transfers and no walking without maximum assistance. He continues to be at fall risk. I would like to see him back in 6 months from now, sooner if the need arises and encouraged the NH staff to call with any interim questions, concerns, problems or updates.

## 2013-08-05 ENCOUNTER — Telehealth: Payer: Self-pay | Admitting: Neurology

## 2013-08-05 DIAGNOSIS — F0390 Unspecified dementia without behavioral disturbance: Secondary | ICD-10-CM

## 2013-08-05 NOTE — Telephone Encounter (Signed)
NP for [facility], states that the rivastigmine (EXELON) 9.5 mg/24hr patch is not approved by patients insurance. Wants to know if the patient can have something else

## 2013-08-10 NOTE — Telephone Encounter (Signed)
According to my records he has been on Exelon patch all along, has there been a change in his insurance? Please find out from his NH.

## 2013-08-11 MED ORDER — RIVASTIGMINE TARTRATE 3 MG PO CAPS: 3.0000 mg | ORAL_CAPSULE | Freq: Two times a day (BID) | ORAL | Status: DC

## 2013-08-11 NOTE — Telephone Encounter (Signed)
Exelon 3 mg capsules: 1 by mouth twice a day, prescription sent to his pharmacy on file. #60 with 5 refills

## 2013-08-11 NOTE — Telephone Encounter (Signed)
Nash Dimmeralled Kim, NP and Selena BattenKim stated that pt's insurance has changed and the insurance company will not pay for the exelon patches, but they will pay for the pills, Selena BattenKim stated. Please advise

## 2013-08-12 NOTE — Telephone Encounter (Signed)
Nash Dimmeralled Kim, RN to inform her per Dr. Frances FurbishAthar that the doctor sent in Rx for Exelon capsule 3 mg, take 1 capsule twice a day. Kim,RN verbalized understanding.

## 2014-01-01 ENCOUNTER — Encounter: Payer: Self-pay | Admitting: Neurology

## 2014-01-01 ENCOUNTER — Encounter (INDEPENDENT_AMBULATORY_CARE_PROVIDER_SITE_OTHER): Payer: Self-pay

## 2014-01-01 ENCOUNTER — Ambulatory Visit (INDEPENDENT_AMBULATORY_CARE_PROVIDER_SITE_OTHER): Payer: PRIVATE HEALTH INSURANCE | Admitting: Neurology

## 2014-01-01 VITALS — BP 104/61 | HR 47 | Temp 98.4°F

## 2014-01-01 DIAGNOSIS — Z9181 History of falling: Secondary | ICD-10-CM

## 2014-01-01 DIAGNOSIS — F0390 Unspecified dementia without behavioral disturbance: Secondary | ICD-10-CM

## 2014-01-01 DIAGNOSIS — G20C Parkinsonism, unspecified: Secondary | ICD-10-CM

## 2014-01-01 DIAGNOSIS — R296 Repeated falls: Secondary | ICD-10-CM

## 2014-01-01 DIAGNOSIS — G20 Parkinson's disease: Secondary | ICD-10-CM

## 2014-01-01 NOTE — Patient Instructions (Signed)
We will continue with Sinemet and Exelon patch at the current doses.   You are at high fall risk: do not stand/transfer or walk without maximum assistance!

## 2014-01-01 NOTE — Progress Notes (Signed)
Subjective:    Patient ID: Earl Hayes is a 80 y.o. male.  HPI    Interim history:   Earl Hayes is a pleasant 80 year old right-handed gentleman with an underlying history of coronary artery disease, hypertension, hyperlipidemia, hearing loss, chronic lumbar spinal stenosis, and memory loss with parkinsonism and gait dysfunction, who presents for followup consultation of his dementia, gait disorder, and parkinsonism. He is accompanied by a staff member from [facility], [place], and his nephew, Earl Hayes today. He himself is unable to provide a Hx. I last saw him on 07/02/2013, at which time his attendant reported no recent falls and no recent changes in his condition. He was not transfering or walking without assistance. He was in physical therapy. I suggested continuation of his Exelon patch and suggested stopping amantadine which was 100 mg once daily at the time and I also suggested he taper off of Comtan which was 100 mg qid at the time, as I was not convinced that he did and her Parkinson's medications. He had evidence of parkinsonism which was mild.   Today, his nephew reports that there were no adverse consequences when he was tapered off of Comtan and amantadine. The patient continues to be at severe fall risk and has fallen in the past and also recently. Most of these falls occur when he is mobilizing without maximum assistance. Thankfully he has not injured himself with the exception of a laceration of his right forearm. He was restarted on Exelon patch after he was taking Exelon capsules since his insurance denied the patch. They were able to appeal this. He has new hearing aids. He has been placed on vitamin D after his level was found to be low.   I saw him on 01/01/13, at which time he was not accompanied by any caretaker and therefore I was not able to get any reliable history. I felt he continued to be at severe fall risk. I reminded him and his caretakers in writing that he should  not stand or walk without assistance.  I first met him on 07/02/2012, which time I felt he had a gait disorder, most likely multifactorial in etiology including secondary to abnormal posture, aging and chronic back disease, parkinsonism without much in the way of lateralization, complicated by dementia. I felt he was overall stable but at risk for falls. His friend reported a recent fall. He injured his left forearm, but did not hit his head or had any loss of consciousness. I did not make any medication changes at the time of his last visit. I felt the patient was not safe to walk by himself, even with a walker. He needed maximum assistance with standing and walking and transferring upon my exam.   He is a former patient of Dr. Tressia Danas and followed with him since 2008. Dr. Erling Cruz had tried him on Sinemet and Exelon with poor response. Requip was tried without benefit. He was last seen by Dr. Erling Cruz on 03/10/2012 at which time he he was advised to continue with Sinemet and Exelon patch, and use his walker at all times. He currently resides at [facility]. His has been on Amantadine 100 mg once daily, baby aspirin once daily, atenolol 25 mg once daily, Exelon patch 9.5 mg once daily, finasteride 5 mg once daily, Flomax 0.4 mg once daily, lisinopril-hydrochlorothiazide 20-25 mg once daily, pravastatin 20 mg at night, senna once daily, entacapone 200 mg half a tablet 4 times a day, Sinemet 25/100 mg strength 2 tablets  4 times a day, at 5 AM, 9 AM, 1 PM and 5 PM, Xanax 0.5 mg as needed for sleep.  There is a family history of Alzheimer's disease in his sister. He was placed on Sinemet plus Comtan with some response. He has a history of low back pain and left posterior thigh pain. MRI L spine on 11/27/2006 showed severe central stenosis at L4-5 secondary to ligamentum flavum hypertrophy, facet hypertrophy., degenerative disease and bulging. He also had moderately severe stenosis at L3-4 and to a lesser extent L2-3.  There is evidence of spondylosis at T11-L1 and neuroforaminal stenosis at L2-3 through L5-S1. There is also evidence of facet arthropathy at L2-3 through L5-S1. He received a single shot of epidural steroids and did well. He has had balance issues. He has no bowel or bladder dysfunction. He denied numbness. Repeat L spine MRI on 11/01/08 was without change. He received another epidural steroids on 08/02/09 with good results. Plain lumbar spine film on 06/02/09 with diffuse DDD and osteopenia at L2-L3 L5-S1. rd of hearing. His power of attorney is his pastor at church, who is his nephew, Earl Hayes. He an his wife, Earl Hayes, reside at [facility]. MMSE on 11/08/11: 28/30, AFT10, CDT 2/4.   His Past Medical History Is Significant For: Past Medical History  Diagnosis Date  . Sleep apnea, obstructive     no cpap  . Abnormality of gait   . Hearing loss   . Dementia   . CAD (coronary artery disease)     non obstructive   . Hypertension   . Parkinson disease   . BPH with obstruction/lower urinary tract symptoms   . ED (erectile dysfunction)   . Tortuous colon   . Hyperlipidemia   . Gait disorder 07/02/2012  . Parkinsonism 07/02/2012    His Past Surgical History Is Significant For: Past Surgical History  Procedure Laterality Date  . Appendectomy    . Tonsillectomy    . Insertion of male prothesis    . Knee surgery      both  . Shoulder surgery      Right    His Family History Is Significant For: Family History  Problem Relation Age of Onset  . Colon cancer Father   . Kidney disease Sister   . Dementia Sister     His Social History Is Significant For: History   Social History  . Marital Status: Married    Spouse Name: Earl Hayes    Number of Children: 3  . Years of Education: N/A   Occupational History  . 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 Smoking status: Former Smoker -- 1.00 packs/day for 47 years    Types: Cigarettes    Quit  date: 04/02/1985  . Smokeless tobacco: Never Used  . Alcohol Use: No  . Drug Use: No  . Sexual Activity: None   Other Topics Concern  . None   Social History Narrative   Consumes Catering manager. Tea daily,is resides [facility],right handed    His Allergies Are:  No Known Allergies:   His Current Medications Are:  Outpatient Encounter Prescriptions as of 01/01/2014  Medication Sig  . acetaminophen (TYLENOL) 325 MG tablet Take 650 mg by mouth every 6 (six) hours as needed for pain.  Marland Kitchen ALPRAZolam (XANAX) 0.5 MG tablet Take 0.5 mg by mouth at bedtime as needed for sleep.   Marland Kitchen amantadine (SYMMETREL) 100 MG capsule  Take 100 mg by mouth daily.    Marland Kitchen aspirin EC 81 MG tablet Take 81 mg by mouth daily.  Marland Kitchen atenolol (TENORMIN) 25 MG tablet Take 25 mg by mouth daily.  . carbidopa-levodopa (SINEMET CR) 25-100 MG per tablet Take 2 tablets by mouth 4 (four) times daily.   Marland Kitchen dextromethorphan-guaiFENesin (ROBITUSSIN-DM) 10-100 MG/5ML liquid Take 5 mLs by mouth every 4 (four) hours as needed for cough.  . docusate sodium (COLACE) 100 MG capsule Take 100 mg by mouth daily.  . entacapone (COMTAN) 200 MG tablet Take 100 mg by mouth 4 (four) times daily.   . finasteride (PROSCAR) 5 MG tablet Take 5 mg by mouth daily.   . furosemide (LASIX) 20 MG tablet Take 20 mg by mouth every 3 (three) days.  Marland Kitchen ipratropium-albuterol (DUONEB) 0.5-2.5 (3) MG/3ML SOLN Take 3 mLs by nebulization 3 (three) times daily.  Marland Kitchen ketoconazole (NIZORAL) 2 % cream Apply 1 application topically 2 (two) times daily. Applied to the face  . ketoconazole (NIZORAL) 2 % shampoo Apply 1 application topically every Wednesday and Saturday. Applied to scalp, chest, and face.  Marland Kitchen lisinopril-hydrochlorothiazide (PRINZIDE,ZESTORETIC) 20-12.5 MG per tablet Take 1 tablet by mouth daily.  . Nasal Dilators STRP Apply topically at bedtime. Applied to the bridge of the nose (Size SM/MD)  . nitroGLYCERIN (NITROSTAT) 0.4 MG SL tablet Place 0.4 mg under the  tongue as needed for chest pain.  Marland Kitchen nystatin (MYCOSTATIN) powder Apply topically every 14 (fourteen) days. Applied to the scrotum and groin  . oxyCODONE-acetaminophen (PERCOCET/ROXICET) 5-325 MG per tablet Take 1 tablet by mouth every 6 (six) hours as needed for pain.  . pravastatin (PRAVACHOL) 10 MG tablet Take 10 mg by mouth at bedtime.  Marland Kitchen Propylene Glycol (SYSTANE BALANCE) 0.6 % SOLN Apply 1 drop to eye 2 (two) times daily.  . rivastigmine (EXELON) 3 MG capsule Take 1 capsule (3 mg total) by mouth 2 (two) times daily.  . rivastigmine (EXELON) 9.5 mg/24hr Place 1 patch onto the skin daily. Patch is applied at 8:59am and removed the following day at 9:00am  . senna (SENOKOT) 8.6 MG tablet Take 1 tablet by mouth daily.  . Tamsulosin HCl (FLOMAX) 0.4 MG CAPS Take 0.4 mg by mouth daily.   :  Review of Systems:  Out of a complete 14 point review of systems, all are reviewed and negative with the exception of these symptoms as listed below:   Review of Systems  HENT: Positive for hearing loss.        Drooling  Respiratory: Positive for cough.   Neurological:       Restless leg, snoring, memory loss, speech difficulty, weakness, tremors  Hematological: Bruises/bleeds easily.  Psychiatric/Behavioral:       Confusion    Objective:  Neurologic Exam  Physical Exam Physical Examination:   Filed Vitals:   01/01/14 1156  BP: 104/61  Pulse: 47  Temp: 98.4 F (36.9 C)    General Examination: The patient is situated in his WC. He appears to be in no acute distress. He is minimally verbal.  HEENT: Normocephalic, atraumatic, pupils are equal, round and reactive to light and accommodation. Extraocular tracking shows moderate saccadic breakdown without nystagmus noted. Hearing is impaired with hearing aids in place bilaterally. Face is symmetric with moderate facial masking and normal facial sensation. There is no lip, neck or jaw tremor. Neck is moderately rigid with intact passive ROM. There  are no carotid bruits on auscultation. Speech is scant and dysarthric and difficult  to understand..  Chest: is clear to auscultation without wheezing, rhonchi or crackles noted.  Heart: sounds are regular and normal without murmurs, rubs or gallops noted.   Abdomen: is soft, non-tender and non-distended with normal bowel sounds appreciated on auscultation.  Extremities: There is trace edema in the ankles bilaterally.   Skin: is dry with no trophic changes noted.   Musculoskeletal: exam reveals no obvious joint deformities, tenderness or joint swelling or erythema.  Neurologically:  Mental status: The patient is awake and alert, paying fair attention. He is unable to provide the history. He is oriented to: person, place, situation. His memory, attention, language and knowledge are impaired. There is no aphasia, agnosia, apraxia or anomia. There is a mild degree of bradyphrenia. Speech is moderately hypophonic with moderated dysarthria noted. Mood is congruent and affect seems normal.   On 01/01/2014: MMSE: 13/30, CDT: 0/4, AFT: 6/min.  Cranial nerves are as described above under HEENT exam. In addition, shoulder shrug is normal with equal shoulder height noted.  Motor exam: Normal bulk, and strength for age is noted. Tone is mildly rigid with absence of cogwheeling in the bilateral extremities. There is overall mild bradykinesia. There is no drift or rebound. There is no tremor. Reflexes are 1+ in the upper extremities and absent in the lower extremities. Fine motor skills: Finger taps, hand movements, and rapid alternating patting, as well as foot taps and foot agility are mild to moderately impaired throughout.    Cerebellar testing shows no dysmetria or intention tremor on finger to nose testing. There is no truncal or gait ataxia.   Sensory exam is intact to light touch in the upper and lower extremities.   Gait, station and balance: I did not ask him to stand or walk today due to fall  risk.   Assessment and Plan:    In summary, MUKESH KORNEGAY is an 80 year old male with a history of severe gait disorder, parkinsonism and dementia. Unfortunately, he is not able to provide a history. He has non-lateralizing parkinsonism and  I think we are dealing with parkinsonism but not idiopathic Parkinson's disease. His memory loss has taken a continuous decline.there is no evidence of behavioral disturbance including absence of hallucinations or delusions. I suggested we continue with Sinemet, 2 pills 4 times a day and Exelon patch 9.5 mg daily. I am reluctant to suggest any new medications because of his complicated history and his advanced age. He is at this point off of amantadine and Comtan. He continues to be at severe fall risk. I suggested no transfers, no standing and no walking without maximum assistance. I would like to see him back in 6 months from now, sooner if the need arises and encouraged the NH staff to call with any interim questions, concerns, problems or updates.

## 2014-06-21 ENCOUNTER — Other Ambulatory Visit (HOSPITAL_COMMUNITY): Payer: Self-pay | Admitting: Internal Medicine

## 2014-06-21 DIAGNOSIS — R1310 Dysphagia, unspecified: Secondary | ICD-10-CM

## 2014-07-05 ENCOUNTER — Ambulatory Visit: Payer: PRIVATE HEALTH INSURANCE | Admitting: Neurology

## 2014-07-06 ENCOUNTER — Other Ambulatory Visit (HOSPITAL_COMMUNITY): Payer: Self-pay | Admitting: Internal Medicine

## 2014-07-06 ENCOUNTER — Ambulatory Visit (HOSPITAL_COMMUNITY): Payer: Medicaid Other | Attending: Internal Medicine | Admitting: Speech Pathology

## 2014-07-06 ENCOUNTER — Ambulatory Visit (HOSPITAL_COMMUNITY)
Admission: RE | Admit: 2014-07-06 | Discharge: 2014-07-06 | Disposition: A | Discharge status: Home / Self Care | Payer: Medicaid Other | Source: Ambulatory Visit | Attending: Internal Medicine | Admitting: Internal Medicine

## 2014-07-06 DIAGNOSIS — R1314 Dysphagia, pharyngoesophageal phase: Secondary | ICD-10-CM | POA: Insufficient documentation

## 2014-07-06 DIAGNOSIS — F0390 Unspecified dementia without behavioral disturbance: Secondary | ICD-10-CM | POA: Insufficient documentation

## 2014-07-06 DIAGNOSIS — I2510 Atherosclerotic heart disease of native coronary artery without angina pectoris: Secondary | ICD-10-CM | POA: Insufficient documentation

## 2014-07-06 DIAGNOSIS — R1310 Dysphagia, unspecified: Secondary | ICD-10-CM | POA: Diagnosis not present

## 2014-07-06 DIAGNOSIS — G20 Parkinson's disease: Secondary | ICD-10-CM | POA: Diagnosis not present

## 2014-07-06 DIAGNOSIS — Z87891 Personal history of nicotine dependence: Secondary | ICD-10-CM | POA: Insufficient documentation

## 2014-07-06 NOTE — Therapy (Signed)
Earl Hayes [facility] [address] [place], [place], [address] Phone: [phone]   Fax:  [phone]  Modified Barium Swallow  Patient Details  Name: Earl SchultzGeorge W Hayes MRN: [id number] Date of Birth: Nov 01, 1928 Referring Provider:  Pearson Hayes, James, MD  Encounter Date: 07/06/2014      End of Session - 07/06/14 2136    Visit Number 1   Number of Visits 1   Authorization Type Medicare/Medicaid   SLP Start Time 1332   SLP Stop Time  1415   SLP Time Calculation (min) 43 min   Activity Tolerance Patient tolerated treatment well      Past Medical History  Diagnosis Date  . Sleep apnea, obstructive     no cpap  . Abnormality of gait   . Hearing loss   . Dementia   . CAD (coronary artery disease)     non obstructive   . Hypertension   . Parkinson disease   . BPH with obstruction/lower urinary tract symptoms   . ED (erectile dysfunction)   . Tortuous colon   . Hyperlipidemia   . Gait disorder 07/02/2012  . Parkinsonism 07/02/2012    Past Surgical History  Procedure Laterality Date  . Appendectomy    . Tonsillectomy    . Insertion of male prothesis    . Knee surgery      both  . Shoulder surgery      Right    There were no vitals filed for this visit.  Visit Diagnosis: Dysphagia, pharyngoesophageal phase      Subjective Assessment - 07/06/14 2121    Subjective Pt only minimally verbal, loss of saliva from oral cavity   Special Tests MBSS   Currently in Pain? No/denies             General - 07/06/14 2122    General Information   Date of Onset 06/21/14   HPI Earl SerGeorge Hayes is a 81 yo male who was referred by Dr. Pearson GrippeJames Hayes for MBSS. Current diet is unknown, aide who accompanied him thinks he is on thickened liquids. Chart review reveals that [facility], "a history of severe gait disorder, parkinsonism and dementia. Unfortunately, he is not able to provide a history. He has non-lateralizing  parkinsonism and I think we are dealing with parkinsonism but not idiopathic Parkinson's disease. His memory loss has taken a continuous decline.there is no evidence of behavioral disturbance including absence of hallucinations or delusions. I suggested we continue with Sinemet, 2 pills 4 times a day and Exelon patch 9.5 mg daily. I am reluctant to suggest any new medications because of his complicated history and his advanced age. He is at this point off of amantadine and Comtan. He continues to be at severe fall risk. I suggested no transfers, no standing and no walking without maximum assistance".    Type of Study Modified Barium Swallowing Study   Reason for Referral Objectively evaluate swallowing function   Diet Prior to this Study Information not available   Temperature Spikes Noted No   Respiratory Status Room air   History of Recent Intubation No   Behavior/Cognition Alert;Cooperative;Requires cueing   Oral Cavity - Dentition Edentulous   Oral Motor / Sensory Function --  difficult to assess   Self-Feeding Abilities Able to feed self with adaptive devices   Patient Positioning Upright in chair   Baseline Vocal Quality Clear   Volitional Cough Cognitively unable to elicit   Volitional Swallow Unable to elicit  Anatomy Within functional limits   Pharyngeal Secretions Not observed secondary MBS            Oral Preparation/Oral Phase - 08/03/2014 Aug 30, 2129    Oral Preparation/Oral Phase   Oral Phase Impaired   Oral - Solids   Oral - Regular Weak lingual manipulation;Reduced posterior propulsion;Piecemeal swallowing;Delayed oral transit   Electrical stimulation - Oral Phase   Was Electrical Stimulation Used No          Pharyngeal Phase - 08/03/14 2129-08-30    Pharyngeal Phase   Pharyngeal Phase Impaired   Pharyngeal - Nectar   Pharyngeal - Nectar Cup Delayed swallow initiation;Premature spillage to valleculae;Premature spillage to pyriform sinuses;Pharyngeal residue - valleculae    Pharyngeal - Thin   Pharyngeal - Thin Cup Delayed swallow initiation;Premature spillage to valleculae;Premature spillage to pyriform sinuses;Pharyngeal residue - valleculae;Penetration/Aspiration during swallow   Penetration/Aspiration details (thin cup) Material does not enter airway;Material enters airway, remains ABOVE vocal cords then ejected out  only 1 episode of trace flash penetration   Pharyngeal - Thin Straw Delayed swallow initiation;Premature spillage to valleculae;Premature spillage to pyriform sinuses;Penetration/Aspiration during swallow;Pharyngeal residue - valleculae   Penetration/Aspiration details (thin straw) Material does not enter airway;Material enters airway, remains ABOVE vocal cords then ejected out   Pharyngeal - Solids   Pharyngeal - Puree Delayed swallow initiation;Premature spillage to valleculae   Pharyngeal - Regular Delayed swallow initiation;Premature spillage to valleculae   Pharyngeal - Pill Within functional limits   Pharyngeal Phase - Comment   Pharyngeal Comment Delay in swallow initiation   Electrical Stimulation - Pharyngeal Phase   Was Electrical Stimulation Used No          Cricopharyngeal Phase - 2014-08-03 2133/08/30    Cervical Esophageal Phase   Cervical Esophageal Phase [facility]                  Plan - 08-03-14 Aug 31, 2135    Clinical Impression Statement Earl Hayes presents with only mild (surprisingly) oropharyngeal phase dysphagia characterized by weak lingual manipulation, inefficient AP transit, premature spillage with variable delay in swallow initiation across consistencies and textures, decreased tongue base retraction resulting in 2 episodes of flash penetration of thins (one with cup sip and one with straw sip over ~8 trials) and mild vallecular residue with thins and nectars which pt eventually spontaneously clears with repeat swallow. Before swallow study began, pt noted to have pooled saliva in oral cavity. Recommend D3/mech soft with  thin liquids via cup sips (straw ok) and pills whole with thin, encourage pt to swallow 2x after each sip. Standard aspiration and reflux precautions. Will fax results to [facility].           G-Codes - 08/03/14 Aug 31, 2143    Functional Assessment Tool Used MBSS; clinical judgment   Functional Limitations Swallowing   Swallow Current Status (B1478) At least 1 percent but less than 20 percent impaired, limited or restricted   Swallow Goal Status (G9562) At least 1 percent but less than 20 percent impaired, limited or restricted   Swallow Discharge Status [id number]) At least 1 percent but less than 20 percent impaired, limited or restricted          Recommendations/Treatment - 08-03-2014 2133-08-30    Swallow Evaluation Recommendations   Diet Recommendations Dysphagia 3 (Mechanical Soft);Thin liquid   Liquid Administration via Cup   Medication Administration Whole meds with liquid   Supervision Staff to assist with self feeding;Full supervision/cueing for compensatory strategies   Compensations Multiple dry swallows after each  bite/sip   Postural Changes and/or Swallow Maneuvers Seated upright 90 degrees;Out of bed for meals;Upright 30-60 min after meal   Oral Care Recommendations Oral care BID   Other Recommendations Clarify dietary restrictions   Follow up Recommendations Skilled Nursing facility          Prognosis - 07/06/14 2136    Prognosis   Prognosis for Safe Diet Advancement Good   Barriers to Reach Goals Cognitive deficits   Individuals Consulted   Consulted and Agree with Results and Recommendations Patient unable/family or caregiver not available   Report Sent to  Referring physician;Facility (Comment)  [facility]      Problem List Patient Active Problem List   Diagnosis Date Noted  . Gait disorder 07/02/2012  . Parkinsonism 07/02/2012  . Gallstones 11/05/2010  . Sebaceous cyst 11/05/2010  . Abnormal chest CT 08/22/2010  . Abnormal findings on imaging of biliary tract 08/22/2010   . GERD 03/06/2010  . CAROTID ARTERY DISEASE 08/22/2009  . Cough 02/02/2009  . PARKINSON'S DISEASE 01/02/2008  . DEMENTIA 10/09/2007  . HEARING LOSS 10/09/2007  . HYPERTENSION 10/09/2007  . ABNORMALITY OF GAIT 10/09/2007   Thank you,  Havery Moros, CCC-SLP [phone]  [facility] 07/06/2014, 9:46 PM  [facility] [address] [place], [place], [address] Phone: [phone]   Fax:  [phone]

## 2014-07-16 ENCOUNTER — Ambulatory Visit (INDEPENDENT_AMBULATORY_CARE_PROVIDER_SITE_OTHER): Payer: Medicare Other | Admitting: Neurology

## 2014-07-16 ENCOUNTER — Encounter: Payer: Self-pay | Admitting: Neurology

## 2014-07-16 VITALS — BP 128/78 | HR 72 | Resp 18

## 2014-07-16 DIAGNOSIS — Z9181 History of falling: Secondary | ICD-10-CM

## 2014-07-16 DIAGNOSIS — F0390 Unspecified dementia without behavioral disturbance: Secondary | ICD-10-CM

## 2014-07-16 DIAGNOSIS — G20 Parkinson's disease: Secondary | ICD-10-CM | POA: Diagnosis not present

## 2014-07-16 DIAGNOSIS — R296 Repeated falls: Secondary | ICD-10-CM

## 2014-07-16 NOTE — Patient Instructions (Signed)
I am not sure you are still benefiting from the higher dose of Sinemet, therefore, I would like to suggest we gradually reduce your Sinemet: I would like for you to take 1 1/2 pills 4 times a day, down from 2 pills 4 times a day and we will monitor your symptoms.

## 2014-07-16 NOTE — Progress Notes (Signed)
Subjective:    Patient ID: Earl Hayes is a 81 y.o. male.  HPI     Interim history:   Earl Hayes is a pleasant 81 year old right-handed gentleman with an underlying history of coronary artery disease, hypertension, hyperlipidemia, hearing loss, chronic lumbar spinal stenosis, and memory loss with parkinsonism and gait dysfunction, who presents for followup consultation of his dementia, gait disorder, and parkinsonism. He is accompanied by a staff member from [facility], today. He himself is unable to provide a Hx. I last saw him on 01/01/2014, at which time his nephew reported that there were no adverse consequences when he was tapered off of Comtan and amantadine. He has fallen in the past and fell recently. He had sustained a laceration of his right forearm. He had restarted Exelon patch since his insurance finally approved it. He had new hearing aids. He had been started on vitamin D. I suggested he continue with Sinemet 2 pills 4 times a day and Exelon patch at 9.5 mg every 24 hours.  Today, 07/16/2014: I reviewed his MAR: He is currently on baby aspirin once daily, atenolol 25 mg once daily, Colace 100 mg once daily, Exelon patch 9.5 mg once daily, finasteride 5 mg once daily, Flomax 0.4 mg once daily, Lasix 20 mg every third day, lisinopril-hydrochlorothiazide once daily, pravastatin 10 mg once daily, senna once daily, vitamin D 50,000 units once daily, Sinemet 25-100 milligrams strength 2 tablets 4 times a day, Xanax 0.5 mg as needed, Mylanta as needed, nitroglycerin as needed, Tylenol as needed. Recent blood work was reviewed from 07/12/2014: CBC with differential was unremarkable, CMP was unremarkable with the exception of slight decrease in total protein at 5.6, lipid profile normal. Earl Hayes reports no falls. He is essentially wheelchair bound. He can transfer with assistance. He can we'll his own wheelchair or mobilizes on wheelchair to the bathroom for example and then use the  grab bars to pull himself up. He has had no recent illness as far she knows. He does not have his hearing aids in and is severely impaired in his hearing which is a confounding factor today. He also does not have his dentures in.   Previously:   I saw him on 07/02/2013, at which time his attendant reported no recent falls and no recent changes in his condition. He was not transfering or walking without assistance. He was in physical therapy. I suggested continuation of his Exelon patch and suggested stopping amantadine which was 100 mg once daily at the time and I also suggested he taper off of Comtan which was 100 mg qid at the time, as I was not convinced that he did and her Parkinson's medications. He had evidence of parkinsonism which was mild.    I saw him on 01/01/13, at which time he was not accompanied by any caretaker and therefore I was not able to get any reliable history. I felt he continued to be at severe fall risk. I reminded him and his caretakers in writing that he should not stand or walk without assistance.   I first met him on 07/02/2012, which time I felt he had a gait disorder, most likely multifactorial in etiology including secondary to abnormal posture, aging and chronic back disease, parkinsonism without much in the way of lateralization, complicated by dementia. I felt he was overall stable but at risk for falls. His friend reported a recent fall. He injured his left forearm, but did not hit his head or had any loss of  consciousness. I did not make any medication changes at the time of his last visit. I felt the patient was not safe to walk by himself, even with a walker. He needed maximum assistance with standing and walking and transferring upon my exam.   He is a former patient of Dr. Tressia Danas and followed with him since 2008. Dr. Erling Cruz had tried him on Sinemet and Exelon with poor response. Requip was tried without benefit. He was last seen by Dr. Erling Cruz on 03/10/2012 at which time  he he was advised to continue with Sinemet and Exelon patch, and use his walker at all times. He currently resides at [facility]. His has been on Amantadine 100 mg once daily, baby aspirin once daily, atenolol 25 mg once daily, Exelon patch 9.5 mg once daily, finasteride 5 mg once daily, Flomax 0.4 mg once daily, lisinopril-hydrochlorothiazide 20-25 mg once daily, pravastatin 20 mg at night, senna once daily, entacapone 200 mg half a tablet 4 times a day, Sinemet 25/100 mg strength 2 tablets 4 times a day, at 5 AM, 9 AM, 1 PM and 5 PM, Xanax 0.5 mg as needed for sleep.   There is a family history of Alzheimer's disease in his sister. He was placed on Sinemet plus Comtan with some response. He has a history of low back pain and left posterior thigh pain. MRI L spine on 11/27/2006 showed severe central stenosis at L4-5 secondary to ligamentum flavum hypertrophy, facet hypertrophy., degenerative disease and bulging. He also had moderately severe stenosis at L3-4 and to a lesser extent L2-3. There is evidence of spondylosis at T11-L1 and neuroforaminal stenosis at L2-3 through L5-S1. There is also evidence of facet arthropathy at L2-3 through L5-S1. He received a single shot of epidural steroids and did well. He has had balance issues. He has no bowel or bladder dysfunction. He denied numbness. Repeat L spine MRI on 11/01/08 was without change. He received another epidural steroids on 08/02/09 with good results. Plain lumbar spine film on 06/02/09 with diffuse DDD and osteopenia at L2-L3 L5-S1. rd of hearing. His power of attorney is his pastor at church, who is his nephew, Earl Hayes. He an his wife, Earl Hayes, reside at [facility]. MMSE on 11/08/11: 28/30, AFT10, CDT 2/4.    His Past Medical History Is Significant For: Past Medical History  Diagnosis Date  . Sleep apnea, obstructive     no cpap  . Abnormality of gait   . Hearing loss   . Dementia   . CAD (coronary artery disease)     non obstructive   .  Hypertension   . Parkinson disease   . BPH with obstruction/lower urinary tract symptoms   . ED (erectile dysfunction)   . Tortuous colon   . Hyperlipidemia   . Gait disorder 07/02/2012  . Parkinsonism 07/02/2012    His Past Surgical History Is Significant For: Past Surgical History  Procedure Laterality Date  . Appendectomy    . Tonsillectomy    . Insertion of male prothesis    . Knee surgery      both  . Shoulder surgery      Right    His Family History Is Significant For: Family History  Problem Relation Age of Onset  . Colon cancer Father   . Kidney disease Sister   . Dementia Sister     His Social History Is Significant For: History   Social History  . Marital Status: Married    Spouse Name: Earl Hayes  .  Number of Children: 3  . Years of Education: N/A   Occupational History  . 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 Smoking status: Former Smoker -- 1.00 packs/day for 47 years    Types: Cigarettes    Quit date: 04/02/1985  . Smokeless tobacco: Never Used  . Alcohol Use: No  . Drug Use: No  . Sexual Activity: Not on file   Other Topics Concern  . None   Social History Narrative   Consumes Catering manager. Tea daily,is resides [facility],right handed    His Allergies Are:  No Known Allergies:   His Current Medications Are:  Outpatient Encounter Prescriptions as of 07/16/2014  Medication Sig  . acetaminophen (TYLENOL) 325 MG tablet Take 650 mg by mouth every 6 (six) hours as needed for pain.  Marland Kitchen ALPRAZolam (XANAX) 0.5 MG tablet Take 0.5 mg by mouth at bedtime as needed for sleep.   Marland Kitchen aspirin EC 81 MG tablet Take 81 mg by mouth daily.  Marland Kitchen atenolol (TENORMIN) 25 MG tablet Take 25 mg by mouth daily.  . carbidopa-levodopa (SINEMET CR) 25-100 MG per tablet Take 2 tablets by mouth 4 (four) times daily.   . cholecalciferol (VITAMIN D) 1000 UNITS tablet Take 1,000 Units by mouth daily.  Marland Kitchen docusate sodium  (COLACE) 100 MG capsule Take 100 mg by mouth daily.  . finasteride (PROSCAR) 5 MG tablet Take 5 mg by mouth daily.   . furosemide (LASIX) 20 MG tablet Take 20 mg by mouth every 3 (three) days.  Marland Kitchen ipratropium-albuterol (DUONEB) 0.5-2.5 (3) MG/3ML SOLN Take 3 mLs by nebulization 3 (three) times daily.  Marland Kitchen ketoconazole (NIZORAL) 2 % cream Apply 1 application topically 2 (two) times daily. Applied to the face  . ketoconazole (NIZORAL) 2 % shampoo Apply 1 application topically every Wednesday and Saturday. Applied to scalp, chest, and face.  Marland Kitchen lisinopril-hydrochlorothiazide (PRINZIDE,ZESTORETIC) 20-12.5 MG per tablet Take 1 tablet by mouth daily.  . Nasal Dilators STRP Apply topically at bedtime. Applied to the bridge of the nose (Size SM/MD)  . nitroGLYCERIN (NITROSTAT) 0.4 MG SL tablet Place 0.4 mg under the tongue as needed for chest pain.  Marland Kitchen nystatin (MYCOSTATIN) powder Apply topically every 14 (fourteen) days. Applied to the scrotum and groin  . oxyCODONE-acetaminophen (PERCOCET/ROXICET) 5-325 MG per tablet Take 1 tablet by mouth every 6 (six) hours as needed for pain.  . pravastatin (PRAVACHOL) 10 MG tablet Take 10 mg by mouth at bedtime.  Marland Kitchen Propylene Glycol (SYSTANE BALANCE) 0.6 % SOLN Apply 1 drop to eye 2 (two) times daily.  . rivastigmine (EXELON) 9.5 mg/24hr Place 1 patch onto the skin daily. Patch is applied at 8:59am and removed the following day at 9:00am  . senna (SENOKOT) 8.6 MG tablet Take 1 tablet by mouth daily.  . Tamsulosin HCl (FLOMAX) 0.4 MG CAPS Take 0.4 mg by mouth daily.   . [DISCONTINUED] amantadine (SYMMETREL) 100 MG capsule Take 100 mg by mouth daily.    . [DISCONTINUED] dextromethorphan-guaiFENesin (ROBITUSSIN-DM) 10-100 MG/5ML liquid Take 5 mLs by mouth every 4 (four) hours as needed for cough.  . [DISCONTINUED] entacapone (COMTAN) 200 MG tablet Take 100 mg by mouth 4 (four) times daily.   . [DISCONTINUED] rivastigmine (EXELON) 3 MG capsule Take 1 capsule (3 mg total) by  mouth 2 (two) times daily.  :  Review of Systems:  Out of a complete 14 point review of systems, all are reviewed  and negative with the exception of these symptoms as listed below:   Review of Systems  All other systems reviewed and are negative.   Objective:  Neurologic Exam  Physical Exam Physical Examination:   Filed Vitals:   07/16/14 0850  BP: 128/78  Pulse: 72  Resp: 18    General Examination: The patient is situated in his WC. He appears to be in no acute distress. He is minimally verbal. he appears somewhat sleepy today. He had to get up around 6 AM for this appointment. He does not have his dentures in or hearing aids. He is not able to participate in MMSE testing today.   HEENT: Normocephalic, atraumatic, pupils are equal, round and reactive to light and accommodation. Extraocular tracking shows moderate saccadic breakdown without nystagmus noted. Hearing is is severely impaired with  absence of hearing aidstoday. Face is symmetric with moderate facial masking and normal facial sensation. There is no lip, neck or jaw tremor. Neck is moderately rigid with intact passive ROM. There are no carotid bruits on auscultation. Speech is scant and dysarthric and very difficult to understand, in particular since he is edentulous also.  Chest: is clear to auscultation without wheezing, rhonchi or crackles noted.  Heart: sounds are regular and normal without murmurs, rubs or gallops noted.   Abdomen: is soft, non-tender and non-distended with normal bowel sounds appreciated on auscultation.  Extremities: There is trace edema in the ankles bilaterally.   Skin: is dry and there is flaky and erythematous changes on his face and neck. There is mild bruising across his hands. This is most likely from his baby aspirin. He may have eczema or seborrheic dermatitis.    Musculoskeletal: exam reveals no obvious joint deformities, tenderness or joint swelling or erythema.  Neurologically:   Mental status: The patient is awake and alert, paying little attention. He is unable to provide the history. He is oriented to: person, perhaps situation. His memory, attention, language and knowledge are  Significantly impaired.  he is minimally verbal. Speech is very difficult to understand specially in light of his absent dentures today.   On 01/01/2014: MMSE: 13/30, CDT: 0/4, AFT: 6/min.  On 07/16/2014: He is unable to participate in MMSE because of severe hearing loss. He indicates with both index fingers pointing to his ears that he cannot understand.  Cranial nerves are as described above under HEENT exam. In addition, shoulder shrug is normal with equal shoulder height noted.  Motor exam: Normal bulk, and strength for age is noted. Tone is mildly rigid with absence of cogwheeling in the bilateral extremities. There is overall mild bradykinesia. There is no drift or rebound. There is no tremor. Reflexes are 1+ in the upper extremities and trace to absent in the lower extremities. Fine motor skills: Finger taps, hand movements, and rapid alternating patting, as well as foot taps and foot agility are mild to moderately impaired throughout.    Cerebellar testing shows no dysmetria or intention tremor on finger to nose testing. There is no truncal or gait ataxia.   Sensory exam is intact to light touch in the upper and lower extremities.   Gait, station and balance: He is not able to stand without maximum assistance and there was no gait belt and no walker.    Assessment and Plan:    In summary, Earl Hayes is an 81 year old male with a history of severe gait disorder, parkinsonism and dementia. Unfortunately, he is not able to provide a history. He  has non-lateralizing parkinsonism and  I think we are dealing with parkinsonism but not idiopathic Parkinson's disease. His memory loss has taken a continuous decline and there is no evidence of behavioral disturbance including absence of  hallucinations or delusions. I am not sure how much benefit he is actually getting from the higher dose of Sinemet at this time. His situation is confounded by his severe hearing loss and absence of hearing aids today. He has evidence of dermatitis. This could be eczema type or seborrheic dermatitis. I suggested we try to taper Sinemet down some. To that end, I suggested we reduce it to 1-1/2 pills 4 times a day. We can continue with Exelon patch at the current strength, which is 9.5 mg every 24 hours as he seems to tolerate this. He is at this point off of amantadine and Comtan. He continues to be at severe fall risk. I suggested no transfers, no standing and no walking without maximum assistance. I would like to see him back in 4-5 months from now, sooner if the need arises and encouraged the NH staff to call with any interim questions, concerns, problems or updates.  Next time we may be able to reduce his Sinemet further to 1 pill 4 times a day.  I discussed this with Earl Hayes today. I spent 20 minutes in total face-to-face time with the patient, more than 50% of which was spent in counseling and coordination of care, reviewing test results, reviewing medication and discussing or reviewing the diagnosis of parkinsonism and advanced dementia.

## 2014-08-16 IMAGING — CR DG CHEST 1V PORT
1 series · 1 of 1 positions shown · non-contrast
Comparison: 08/04/2010.

CLINICAL DATA: Chest pain with dementia.

PORTABLE CHEST - 1 VIEW

[portable]
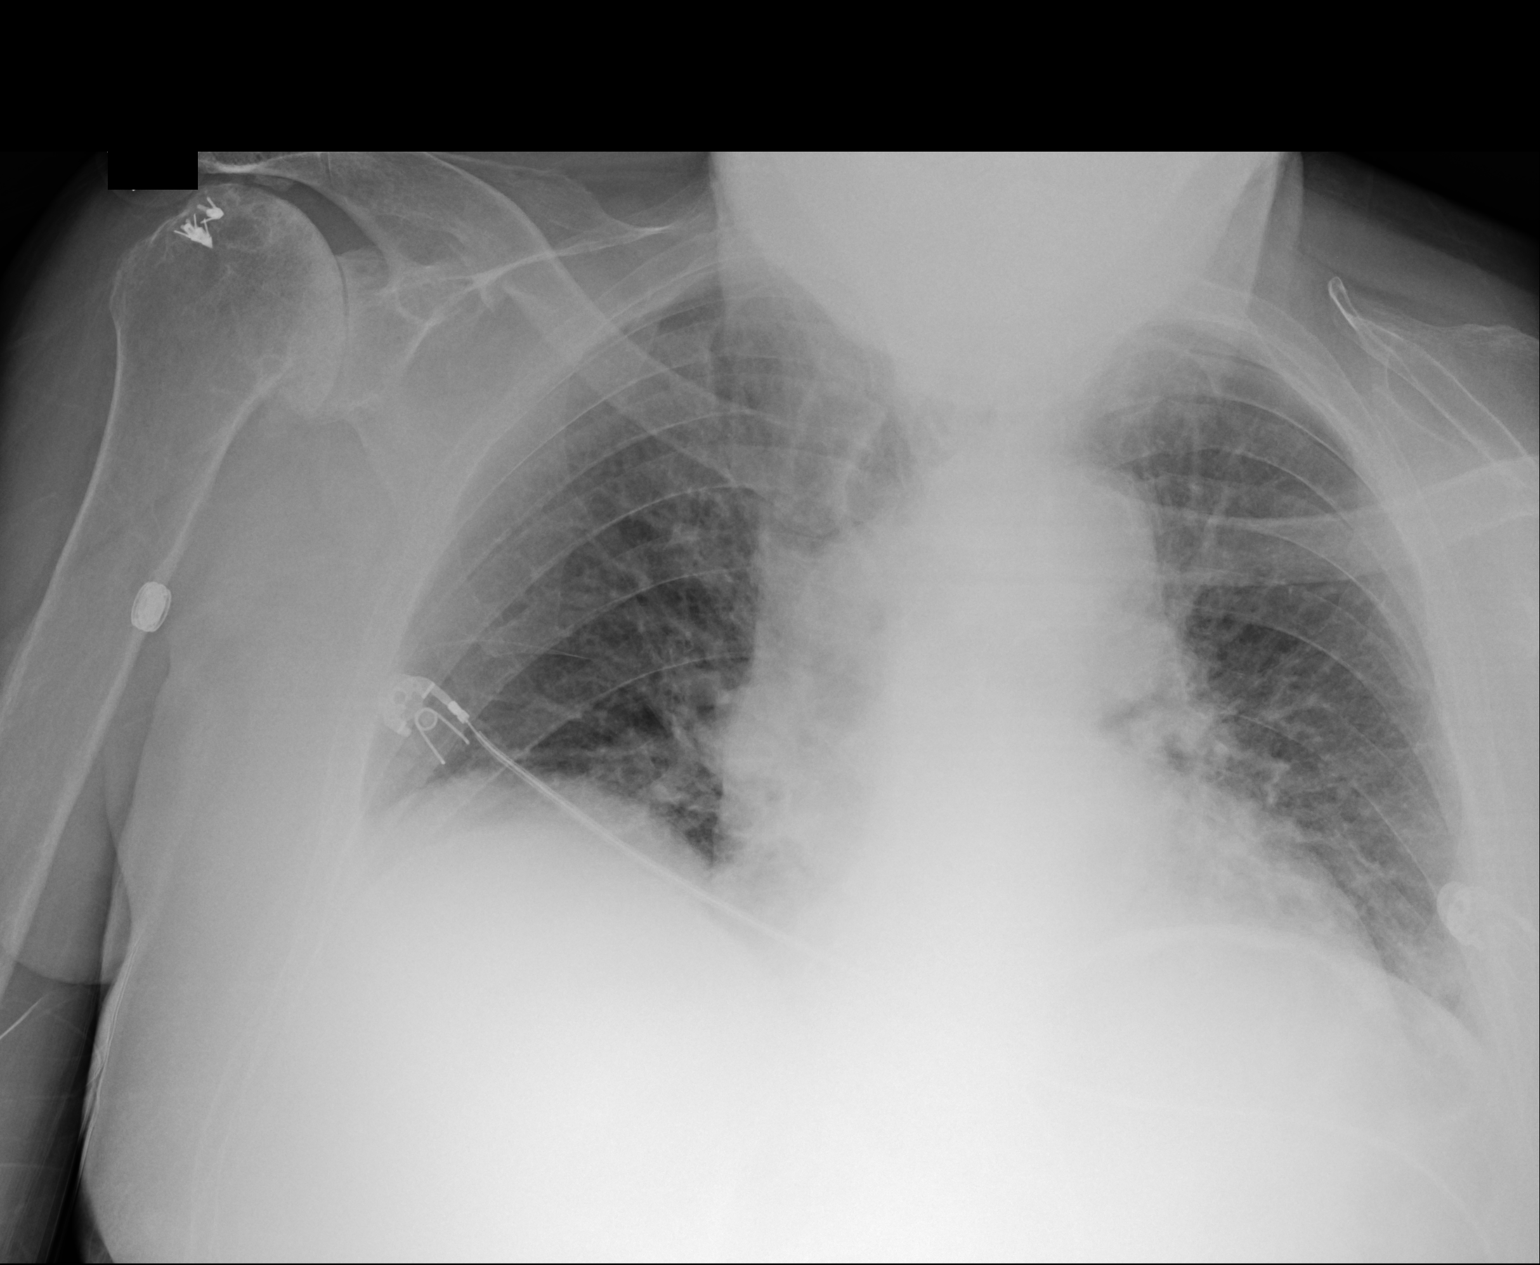

[1 of 1 positions shown; findings below may reference images not displayed]

FINDINGS: Low lung volumes.  Cardiomegaly.  Mild vascular
congestion.  No overt failure or focal infiltrates. Mild vascular
congestion.  Mild bibasilar scarring or subsegmental atelectasis.
No effusion or pneumothorax.  Advanced degenerative change right
shoulder.  Worsening aeration from priors.
IMPRESSION: Cardiomegaly with low lung volumes. Mild vascular congestion.
Worsening aeration from priors.

## 2014-12-21 ENCOUNTER — Ambulatory Visit (INDEPENDENT_AMBULATORY_CARE_PROVIDER_SITE_OTHER): Payer: Medicare Other | Admitting: Neurology

## 2014-12-21 ENCOUNTER — Encounter: Payer: Self-pay | Admitting: Neurology

## 2014-12-21 VITALS — BP 118/68 | HR 68 | Resp 14

## 2014-12-21 DIAGNOSIS — R296 Repeated falls: Secondary | ICD-10-CM

## 2014-12-21 DIAGNOSIS — G20 Parkinson's disease: Secondary | ICD-10-CM

## 2014-12-21 DIAGNOSIS — F0390 Unspecified dementia without behavioral disturbance: Secondary | ICD-10-CM | POA: Diagnosis not present

## 2014-12-21 DIAGNOSIS — Z9181 History of falling: Secondary | ICD-10-CM

## 2014-12-21 NOTE — Patient Instructions (Signed)
We will continue with your Sinemet 1-1/2 pills 4 times a day. Please try to drink more water. I will see you back routinely in 6 months, sooner if needed.

## 2014-12-21 NOTE — Progress Notes (Signed)
Subjective:    Patient ID: Earl Hayes is a 81 y.o. male.  HPI     Interim history:  Earl Hayes is a pleasant 81 year old right-handed gentleman with an underlying history of coronary artery disease, hypertension, hyperlipidemia, hearing loss, chronic lumbar spinal stenosis, and memory loss with parkinsonism and gait dysfunction, who presents for followup consultation of his dementia, gait disorder, and parkinsonism. He is accompanied by a staff member from [place], [employer] with transportation, today. He himself is unable to provide a Hx. I last saw him on 07/16/2014, at which time I reviewed all his medications: baby aspirin once daily, atenolol 25 mg once daily, Colace 100 mg once daily, Exelon patch 9.5 mg once daily, finasteride 5 mg once daily, Flomax 0.4 mg once daily, Lasix 20 mg every third day, lisinopril-hydrochlorothiazide once daily, pravastatin 10 mg once daily, senna once daily, vitamin D 50,000 units once daily, Sinemet 25-100 milligrams strength 2 tablets 4 times a day, Xanax 0.5 mg as needed, Mylanta as needed, nitroglycerin as needed, Tylenol as needed. Recent blood work was reviewed from 07/12/2014: CBC with differential was unremarkable, CMP was unremarkable with the exception of slight decrease in total protein at 5.6, lipid profile normal. His caretaker reported that he was essentially wheelchair-bound. He was able to transfer with assistance and was able to use his own wheelchair to the bathroom and then use the grab bars to pull himself up. He did not have his hearing aids in place last time and the visit was therefore quite difficult. He did not have his dentures. I was not convinced that he continue to benefit from Sinemet at the higher dose. I suggested we gradually try to reduce it. I suggested we reduce it to 1-1/2 pills 4 times a day, down from 2 pills 4 times a day.   Today, 12/21/2014: I briefly talked with one of the nurses at [facility] on speaker phone, she  reported no new issues, no recent falls. He drinks about 3 glasses of water per day with his meals. She reported not noticing any adverse changes after we reduced his Sinemet from 2 pills 4 times a day to 1-1/2 pills 4 times a day. He is not able to provide any history. He is nonverbal.  Previously:   I saw him on 01/01/2014, at which time his nephew reported that there were no adverse consequences when he was tapered off of Comtan and amantadine. He has fallen in the past and fell recently. He had sustained a laceration of his right forearm. He had restarted Exelon patch since his insurance finally approved it. He had new hearing aids. He had been started on vitamin D. I suggested he continue with Sinemet 2 pills 4 times a day and Exelon patch at 9.5 mg every 24 hours.   I saw him on 07/02/2013, at which time his attendant reported no recent falls and no recent changes in his condition. He was not transfering or walking without assistance. He was in physical therapy. I suggested continuation of his Exelon patch and suggested stopping amantadine which was 100 mg once daily at the time and I also suggested he taper off of Comtan which was 100 mg qid at the time, as I was not convinced that he did and her Parkinson's medications. He had evidence of parkinsonism which was mild.    I saw him on 01/01/13, at which time he was not accompanied by any caretaker and therefore I was not able to get any reliable history.  I felt he continued to be at severe fall risk. I reminded him and his caretakers in writing that he should not stand or walk without assistance.   I first met him on 07/02/2012, which time I felt he had a gait disorder, most likely multifactorial in etiology including secondary to abnormal posture, aging and chronic back disease, parkinsonism without much in the way of lateralization, complicated by dementia. I felt he was overall stable but at risk for falls. His friend reported a recent fall. He  injured his left forearm, but did not hit his head or had any loss of consciousness. I did not make any medication changes at the time of his last visit. I felt the patient was not safe to walk by himself, even with a walker. He needed maximum assistance with standing and walking and transferring upon my exam.   He is a former patient of Dr. Tressia Danas and followed with him since 2008. Dr. Erling Cruz had tried him on Sinemet and Exelon with poor response. Requip was tried without benefit. He was last seen by Dr. Erling Cruz on 03/10/2012 at which time he he was advised to continue with Sinemet and Exelon patch, and use his walker at all times. He currently resides at [facility]. His has been on Amantadine 100 mg once daily, baby aspirin once daily, atenolol 25 mg once daily, Exelon patch 9.5 mg once daily, finasteride 5 mg once daily, Flomax 0.4 mg once daily, lisinopril-hydrochlorothiazide 20-25 mg once daily, pravastatin 20 mg at night, senna once daily, entacapone 200 mg half a tablet 4 times a day, Sinemet 25/100 mg strength 2 tablets 4 times a day, at 5 AM, 9 AM, 1 PM and 5 PM, Xanax 0.5 mg as needed for sleep.   There is a family history of Alzheimer's disease in his sister. He was placed on Sinemet plus Comtan with some response. He has a history of low back pain and left posterior thigh pain. MRI L spine on 11/27/2006 showed severe central stenosis at L4-5 secondary to ligamentum flavum hypertrophy, facet hypertrophy., degenerative disease and bulging. He also had moderately severe stenosis at L3-4 and to a lesser extent L2-3. There is evidence of spondylosis at T11-L1 and neuroforaminal stenosis at L2-3 through L5-S1. There is also evidence of facet arthropathy at L2-3 through L5-S1. He received a single shot of epidural steroids and did well. He has had balance issues. He has no bowel or bladder dysfunction. He denied numbness. Repeat L spine MRI on 11/01/08 was without change. He received another epidural  steroids on 08/02/09 with good results. Plain lumbar spine film on 06/02/09 with diffuse DDD and osteopenia at L2-L3 L5-S1. rd of hearing. His power of attorney is his pastor at church, who is his nephew, Ronda Fairly. He an his wife, Bertram Millard, reside at [facility]. MMSE on 11/08/11: 28/30, AFT10, CDT 2/4.    His Past Medical History Is Significant For: Past Medical History  Diagnosis Date  . Sleep apnea, obstructive     no cpap  . Abnormality of gait   . Hearing loss   . Dementia   . CAD (coronary artery disease)     non obstructive   . Hypertension   . Parkinson disease   . BPH with obstruction/lower urinary tract symptoms   . ED (erectile dysfunction)   . Tortuous colon   . Hyperlipidemia   . Gait disorder 07/02/2012  . Parkinsonism 07/02/2012    His Past Surgical History Is Significant For: Past  Surgical History  Procedure Laterality Date  . Appendectomy    . Tonsillectomy    . Insertion of male prothesis    . Knee surgery      both  . Shoulder surgery      Right    His Family History Is Significant For: Family History  Problem Relation Age of Onset  . Colon cancer Father   . Kidney disease Sister   . Dementia Sister     His Social History Is Significant For: Social History   Social History  . Marital Status: Married    Spouse Name: Ruby  . Number of Children: 3  . Years of Education: N/A   Occupational History  . 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 Smoking status: Former Smoker -- 1.00 packs/day for 47 years    Types: Cigarettes    Quit date: 04/02/1985  . Smokeless tobacco: Never Used  . Alcohol Use: No  . Drug Use: No  . Sexual Activity: Not Asked   Other Topics Concern  . None   Social History Narrative   Consumes Catering manager. Tea daily,is resides [facility],right handed    His Allergies Are:  No Known Allergies:   His Current Medications Are:  Outpatient Encounter Prescriptions as of  12/21/2014  Medication Sig  . acetaminophen (TYLENOL) 325 MG tablet Take 650 mg by mouth every 6 (six) hours as needed for pain.  Marland Kitchen ALPRAZolam (XANAX) 0.5 MG tablet Take 0.5 mg by mouth at bedtime as needed for sleep.   Marland Kitchen aspirin EC 81 MG tablet Take 81 mg by mouth daily.  Marland Kitchen atenolol (TENORMIN) 25 MG tablet Take 25 mg by mouth daily.  . carbidopa-levodopa (SINEMET CR) 25-100 MG per tablet Take 1.5 tablets by mouth 4 (four) times daily.   . cephALEXin (KEFLEX) 250 MG capsule Take by mouth 4 (four) times daily.  . cholecalciferol (VITAMIN D) 1000 UNITS tablet Take 1,000 Units by mouth daily.  Marland Kitchen docusate sodium (COLACE) 100 MG capsule Take 100 mg by mouth daily.  . finasteride (PROSCAR) 5 MG tablet Take 5 mg by mouth daily.   . furosemide (LASIX) 20 MG tablet Take 20 mg by mouth every 3 (three) days.  Marland Kitchen lisinopril-hydrochlorothiazide (PRINZIDE,ZESTORETIC) 20-12.5 MG per tablet Take 1 tablet by mouth daily.  . nitroGLYCERIN (NITROSTAT) 0.4 MG SL tablet Place 0.4 mg under the tongue as needed for chest pain.  Marland Kitchen oxyCODONE-acetaminophen (PERCOCET/ROXICET) 5-325 MG per tablet Take 1 tablet by mouth every 6 (six) hours as needed for pain.  . pravastatin (PRAVACHOL) 10 MG tablet Take 10 mg by mouth at bedtime.  . rivastigmine (EXELON) 9.5 mg/24hr Place 1 patch onto the skin daily. Patch is applied at 8:59am and removed the following day at 9:00am  . senna (SENOKOT) 8.6 MG tablet Take 1 tablet by mouth daily.  . Tamsulosin HCl (FLOMAX) 0.4 MG CAPS Take 0.4 mg by mouth daily.   . [DISCONTINUED] ipratropium-albuterol (DUONEB) 0.5-2.5 (3) MG/3ML SOLN Take 3 mLs by nebulization 3 (three) times daily.  . [DISCONTINUED] ketoconazole (NIZORAL) 2 % cream Apply 1 application topically 2 (two) times daily. Applied to the face  . [DISCONTINUED] ketoconazole (NIZORAL) 2 % shampoo Apply 1 application topically every Wednesday and Saturday. Applied to scalp, chest, and face.  . [DISCONTINUED] Nasal Dilators STRP Apply  topically at bedtime. Applied to the bridge of the nose (Size SM/MD)  . [DISCONTINUED] nystatin (MYCOSTATIN) powder  Apply topically every 14 (fourteen) days. Applied to the scrotum and groin  . [DISCONTINUED] Propylene Glycol (SYSTANE BALANCE) 0.6 % SOLN Apply 1 drop to eye 2 (two) times daily.   Facility-Administered Encounter Medications as of 12/21/2014  Medication  . Influenza (>/= 3 years) inactive virus vaccine (FLVIRIN/FLUZONE) injection SUSP 0.5 mL  :  Review of Systems:  Out of a complete 14 point review of systems, all are reviewed and negative with the exception of these symptoms as listed below:   Review of Systems  Neurological:       No new problems reported or communicated. Unable to do MMSE, patient does not have hearing aids and is non-verbal.     Objective:  Neurologic Exam  Physical Exam Physical Examination:   Filed Vitals:   12/21/14 1134  BP: 118/68  Pulse: 68  Resp: 14    General Examination: The patient is situated in his WC. He appears to be in no acute distress. He is not able to participate in MMSE testing today.   HEENT: Normocephalic, atraumatic, pupils are equal, round and reactive to light and accommodation. Extraocular tracking shows moderate saccadic breakdown without nystagmus noted. Hearing is is severely impaired with absence of hearing aids. Face is symmetric with moderate facial masking and normal facial sensation. There is no lip, neck or jaw tremor. Neck is moderately rigid with limitation in passive and active range of motion. There are no carotid bruits on auscultation. He is edentulous  and oropharynx shows thicker saliva and moderate mouth dryness. He is nonverbal.   Chest: is clear to auscultation without wheezing, rhonchi or crackles noted.  Heart: sounds are regular and normal without murmurs, rubs or gallops noted.   Abdomen: is soft, non-tender and non-distended with normal bowel sounds appreciated on auscultation.  Extremities:  There is trace edema in the ankles bilaterally.   Skin: is dry and there is flaky and erythematous changes on his face and neck. There is mild bruising across his hands and forearms. This is most likely from his baby aspirin. He may have eczema or seborrheic dermatitis.    Musculoskeletal: exam reveals no obvious joint deformities, tenderness or joint swelling or erythema.  Neurologically:  Mental status: The patient is awake and alert, paying little attention. He is unable to provide the history. He is  nonverbal. He is not following any verbal commands only. He can mimic. He tracks.  On 01/01/2014: MMSE: 13/30, CDT: 0/4, AFT: 6/min.  On 07/16/2014: He is unable to participate in MMSE because of severe hearing loss. He indicates with both index fingers pointing to his ears that he cannot understand.  Cranial nerves are as described above under HEENT exam.  Motor exam: Normal bulk, and strength for age is noted. Tone is mild to moderately rigid in the upper and lower extremities. There is some cogwheeling bilaterally as well. There is mild to moderate bradykinesia. There is no resting or action tremor. Reflexes are 1+ in the upper extremities and  in the ankles, trace in both knees. Fine motor skills are globally moderately impaired.     Cerebellar testing  is not further possible.   Sensory exam  seems to be intact to light touch in the upper and lower extremities.   Gait, station and balance: He is not able to stand without maximum assistance and there was no gait belt and no walker available today.    Assessment and Plan:    In summary, Earl Hayes is an 81 year old  male with a history of severe gait disorder, parkinsonism and dementia. Unfortunately, he is not able to provide a history. He has non-lateralizing parkinsonism and  I think we are dealing with parkinsonism but not idiopathic Parkinson's disease. His memory loss has taken a continuous decline and there is no evidence of  behavioral disturbance including absence of hallucinations or delusions (per report, hard to assess for this at this time). I am not sure how much benefit he is actually getting from the Sinemet. His situation is confounded by his severe hearing loss, absence of someone who knows him well, no family present at these last visits, advancing age, multiple medications, and overall general decline and deconditioning. Perhaps dehydration or verge of dehydration. I suggested we continue his current medications which include Sinemet 1-1/2 pills 4 times a day and Exelon patch 9.5 mg every 24 hours as he seems to tolerate this. He is at this point off of amantadine and Comtan. He continues to be at severe fall risk. I suggested a six-month follow-up. Unfortunately, at this point I'm not sure how much I am actually contributing to his care. His visits have not been very fruitful I think. I would like to get some input from someone who knows him. What I can see is that he has taken a steady decline overall. This may be a function of multiple issues and most likely not just due to 1 problem.  I spent 20 minutes in total face-to-face time with the patient, more than 50% of which was spent in counseling and coordination of care, reviewing test results, reviewing medication and discussing or reviewing the diagnosis of parkinsonism and advanced dementia.

## 2015-03-03 DEATH — deceased

## 2015-06-20 ENCOUNTER — Ambulatory Visit: Payer: Medicare Other | Admitting: Neurology

## 2015-06-20 ENCOUNTER — Telehealth: Payer: Self-pay

## 2015-06-20 NOTE — Telephone Encounter (Signed)
Patient did not show to appt today  

## 2015-06-22 ENCOUNTER — Encounter: Payer: Self-pay | Admitting: Neurology

## 2015-06-28 ENCOUNTER — Encounter (HOSPITAL_COMMUNITY): Payer: Self-pay

## 2017-01-01 ENCOUNTER — Telehealth: Payer: Self-pay | Admitting: Gastroenterology

## 2017-01-01 NOTE — Telephone Encounter (Signed)
Letter mailed to pt.  

## 2017-01-01 NOTE — Telephone Encounter (Signed)
Recall for tcs °

## 2017-02-05 ENCOUNTER — Telehealth: Payer: Self-pay

## 2017-02-05 NOTE — Telephone Encounter (Signed)
[facility] I called and left Vm for pt to call to schedule his next colonoscopy.
